# Patient Record
Sex: Female | Born: 1990 | Race: Black or African American | Marital: Single | Smoking: Never smoker
Health system: Southern US, Community
[De-identification: ages and names within clinical notes are randomized; demographics above are authoritative.]

## PROBLEM LIST (undated history)

## (undated) DIAGNOSIS — F32A Depression, unspecified: Secondary | ICD-10-CM

## (undated) DIAGNOSIS — F819 Developmental disorder of scholastic skills, unspecified: Secondary | ICD-10-CM

## (undated) DIAGNOSIS — R4189 Other symptoms and signs involving cognitive functions and awareness: Secondary | ICD-10-CM

## (undated) DIAGNOSIS — F419 Anxiety disorder, unspecified: Secondary | ICD-10-CM

## (undated) DIAGNOSIS — K219 Gastro-esophageal reflux disease without esophagitis: Secondary | ICD-10-CM

## (undated) DIAGNOSIS — F329 Major depressive disorder, single episode, unspecified: Secondary | ICD-10-CM

---

## 2011-10-28 ENCOUNTER — Other Ambulatory Visit (HOSPITAL_COMMUNITY): Payer: Self-pay | Admitting: Internal Medicine

## 2011-10-28 ENCOUNTER — Ambulatory Visit (HOSPITAL_COMMUNITY)
Admission: RE | Admit: 2011-10-28 | Discharge: 2011-10-28 | Disposition: A | Discharge status: Home / Self Care | Payer: Medicaid Other | Source: Ambulatory Visit | Attending: Internal Medicine | Admitting: Internal Medicine

## 2011-10-28 DIAGNOSIS — R52 Pain, unspecified: Secondary | ICD-10-CM

## 2011-10-28 DIAGNOSIS — M79609 Pain in unspecified limb: Secondary | ICD-10-CM | POA: Insufficient documentation

## 2012-11-18 ENCOUNTER — Encounter (HOSPITAL_COMMUNITY): Payer: Self-pay | Admitting: Emergency Medicine

## 2012-11-18 ENCOUNTER — Emergency Department (HOSPITAL_COMMUNITY)
Admission: EM | Admit: 2012-11-18 | Discharge: 2012-11-18 | Disposition: A | Discharge status: Home / Self Care | Payer: Medicaid Other | Attending: Emergency Medicine | Admitting: Emergency Medicine

## 2012-11-18 ENCOUNTER — Emergency Department (HOSPITAL_COMMUNITY): Payer: Medicaid Other

## 2012-11-18 DIAGNOSIS — K824 Cholesterolosis of gallbladder: Secondary | ICD-10-CM | POA: Insufficient documentation

## 2012-11-18 DIAGNOSIS — R109 Unspecified abdominal pain: Secondary | ICD-10-CM

## 2012-11-18 DIAGNOSIS — N390 Urinary tract infection, site not specified: Secondary | ICD-10-CM | POA: Insufficient documentation

## 2012-11-18 DIAGNOSIS — R110 Nausea: Secondary | ICD-10-CM | POA: Insufficient documentation

## 2012-11-18 DIAGNOSIS — Z3202 Encounter for pregnancy test, result negative: Secondary | ICD-10-CM | POA: Insufficient documentation

## 2012-11-18 DIAGNOSIS — R1011 Right upper quadrant pain: Secondary | ICD-10-CM | POA: Insufficient documentation

## 2012-11-18 DIAGNOSIS — R197 Diarrhea, unspecified: Secondary | ICD-10-CM | POA: Insufficient documentation

## 2012-11-18 DIAGNOSIS — R358 Other polyuria: Secondary | ICD-10-CM | POA: Insufficient documentation

## 2012-11-18 DIAGNOSIS — R3589 Other polyuria: Secondary | ICD-10-CM | POA: Insufficient documentation

## 2012-11-18 LAB — COMPREHENSIVE METABOLIC PANEL
ALT: 54 U/L — ABNORMAL HIGH (ref 0–35)
Albumin: 3.6 g/dL (ref 3.5–5.2)
Alkaline Phosphatase: 66 U/L (ref 39–117)
BUN: 13 mg/dL (ref 6–23)
CO2: 27 mEq/L (ref 19–32)
Chloride: 101 mEq/L (ref 96–112)
Creatinine, Ser: 0.83 mg/dL (ref 0.50–1.10)
GFR calc Af Amer: >90 mL/min (ref >90)
GFR calc non Af Amer: >90 mL/min (ref >90)
Glucose, Bld: 118 mg/dL — ABNORMAL HIGH (ref 70–99)
Potassium: 3.7 mEq/L (ref 3.5–5.1)
Total Bilirubin: 0.2 mg/dL — ABNORMAL LOW (ref 0.3–1.2)

## 2012-11-18 LAB — URINALYSIS, ROUTINE W REFLEX MICROSCOPIC
Bilirubin Urine: NEGATIVE
Ketones, ur: NEGATIVE mg/dL
Nitrite: POSITIVE — AB
Protein, ur: 100 mg/dL — AB
Urobilinogen, UA: 1 mg/dL (ref 0.0–1.0)

## 2012-11-18 LAB — CBC WITH DIFFERENTIAL/PLATELET
Basophils Relative: 0 % (ref 0–1)
Eosinophils Absolute: 0.1 10*3/uL (ref 0.0–0.7)
HCT: 31.7 % — ABNORMAL LOW (ref 36.0–46.0)
Hemoglobin: 10.9 g/dL — ABNORMAL LOW (ref 12.0–15.0)
Lymphs Abs: 2.5 10*3/uL (ref 0.7–4.0)
MCH: 28.7 pg (ref 26.0–34.0)
MCHC: 34.4 g/dL (ref 30.0–36.0)
MCV: 83.4 fL (ref 78.0–100.0)
Monocytes Absolute: 0.5 10*3/uL (ref 0.1–1.0)
Monocytes Relative: 6 % (ref 3–12)
Neutro Abs: 5 10*3/uL (ref 1.7–7.7)
Neutrophils Relative %: 61 % (ref 43–77)
RBC: 3.8 MIL/uL — ABNORMAL LOW (ref 3.87–5.11)

## 2012-11-18 LAB — URINE MICROSCOPIC-ADD ON

## 2012-11-18 LAB — LIPASE, BLOOD: Lipase: 31 U/L (ref 11–59)

## 2012-11-18 MED ORDER — ONDANSETRON 4 MG PO TBDP
4.0000 mg | ORAL_TABLET | Freq: Once | ORAL | Status: AC
  Administered 2012-11-18: 4 mg via ORAL
  Filled 2012-11-18: qty 1

## 2012-11-18 MED ORDER — TRAMADOL HCL 50 MG PO TABS
50.0000 mg | ORAL_TABLET | Freq: Once | ORAL | Status: AC
  Administered 2012-11-18: 50 mg via ORAL
  Filled 2012-11-18: qty 1

## 2012-11-18 MED ORDER — TRAMADOL HCL 50 MG PO TABS: 50.0000 mg | ORAL_TABLET | Freq: Four times a day (QID) | ORAL | Status: DC | PRN

## 2012-11-18 MED ORDER — SULFAMETHOXAZOLE-TMP DS 800-160 MG PO TABS: 1.0000 | ORAL_TABLET | Freq: Two times a day (BID) | ORAL | Status: AC

## 2012-11-18 MED ORDER — SULFAMETHOXAZOLE-TMP DS 800-160 MG PO TABS
1.0000 | ORAL_TABLET | Freq: Once | ORAL | Status: AC
  Administered 2012-11-18: 1 via ORAL
  Filled 2012-11-18: qty 1

## 2012-11-18 MED ORDER — ONDANSETRON 4 MG PO TBDP: 4.0000 mg | ORAL_TABLET | Freq: Three times a day (TID) | ORAL | Status: DC | PRN

## 2012-11-18 NOTE — ED Provider Notes (Signed)
CSN: [id number]     Arrival date & time 11/18/12  2134 History   First MD Initiated Contact with Patient 11/18/12 2225     Chief Complaint  Patient presents with  . Abdominal Pain   HPI  Patient presents with at least one week of nausea, right anterior upper abdominal pain. Onset was unclear, but it seems to be worse over the past week. Pain seems more pronounced after eating, better at rest.  Patient also complains of no polyuria.  No hematuria, no dysuria.  No fevers, chills, no chest pain, no other abdominal pain, no vaginal complaints.   History reviewed. No pertinent past medical history. History reviewed. No pertinent past surgical history. No family history on file. History  Substance Use Topics  . Smoking status: Never Smoker   . Smokeless tobacco: Not on file  . Alcohol Use: No   OB History   Grav Para Term Preterm Abortions TAB SAB Ect Mult Living                 Review of Systems  Constitutional:       Per HPI, otherwise negative  HENT:       Per HPI, otherwise negative  Respiratory:       Per HPI, otherwise negative  Cardiovascular:       Per HPI, otherwise negative  Gastrointestinal: Positive for nausea and diarrhea. Negative for vomiting.  Endocrine: Positive for polyuria.  Genitourinary:       Neg aside from HPI   Musculoskeletal:       Per HPI, otherwise negative  Skin: Negative.   Neurological: Negative for syncope.    Allergies  Review of patient's allergies indicates no known allergies.  Home Medications   Current Outpatient Rx  Name  Route  Sig  Dispense  Refill  . aspirin-sod bicarb-citric acid (ALKA-SELTZER) 325 MG TBEF tablet   Oral   Take 325 mg by mouth once.         . Bisacodyl (LAXATIVE PO)   Oral   Take 1 tablet by mouth once.         Marland Kitchen PRESCRIPTION MEDICATION   Oral   Take 1 tablet by mouth daily.          BP 133/71  Pulse 93  Temp(Src) 98.2 F (36.8 C) (Oral)  Resp 18  Ht 5\' 3"  (1.6 m)  Wt 215 lb (97.523 kg)   BMI 38.09 kg/m2  SpO2 100%  LMP 11/11/2012 Physical Exam  Nursing note and vitals reviewed. Constitutional: She is oriented to person, place, and time. She appears well-developed and well-nourished. No distress.  HENT:  Head: Normocephalic and atraumatic.  Eyes: Conjunctivae and EOM are normal.  Cardiovascular: Normal rate and regular rhythm.   Pulmonary/Chest: Effort normal and breath sounds normal. No stridor. No respiratory distress.  Abdominal: She exhibits no distension. There is no tenderness. There is no rebound and no guarding.  Patient indicates that her pain is at the infracostal margin on R mid anterior chest  Musculoskeletal: She exhibits no edema.  Neurological: She is alert and oriented to person, place, and time. No cranial nerve deficit.  Skin: Skin is warm and dry.  Psychiatric: She has a normal mood and affect.    ED Course  Procedures (including critical care time) Labs Review Labs Reviewed  COMPREHENSIVE METABOLIC PANEL - Abnormal; Notable for the following:    Glucose, Bld 118 (*)    AST 52 (*)    ALT 54 (*)  Total Bilirubin 0.2 (*)    All other components within normal limits  URINALYSIS, ROUTINE W REFLEX MICROSCOPIC - Abnormal; Notable for the following:    APPearance TURBID (*)    Hgb urine dipstick LARGE (*)    Protein, ur 100 (*)    Nitrite POSITIVE (*)    Leukocytes, UA MODERATE (*)    All other components within normal limits  URINE MICROSCOPIC-ADD ON - Abnormal; Notable for the following:    Squamous Epithelial / LPF FEW (*)    Bacteria, UA MANY (*)    All other components within normal limits  URINE CULTURE  LIPASE, BLOOD  CBC WITH DIFFERENTIAL  POCT PREGNANCY, URINE   Imaging Review US Abdomen Complete  11/18/2012   CLINICAL DATA:  Right upper quadrant pain.  EXAM: ULTRASOUND ABDOMEN COMPLETE  COMPARISON:  None.  FINDINGS: Gallbladder  4 mm non mobile non shadowing focus along the gallbladder wall, likely small polyp. No wall thickening  or visible stones. Negative sonographic Murphy's.  Common bile duct  Diameter: Normal caliber, 3 mm.  Liver  No focal lesion identified. Within normal limits in parenchymal echogenicity.  IVC  No abnormality visualized.  Pancreas  Visualized portion unremarkable.  Spleen  Size and appearance within normal limits.  Right Kidney  Length: 9.6 cm. Echogenicity within normal limits. No mass or hydronephrosis visualized.  Left Kidney  Length: 10.9 cm. Echogenicity within normal limits. No mass or hydronephrosis visualized.  Abdominal aorta  No aneurysm visualized.  IMPRESSION: Small gallbladder wall polyp.  No acute findings.   Electronically Signed   By: Charlett Nose M.D.   On: 11/18/2012 22:44    MDM  No diagnosis found. This generally well-appearing female, afebrile, with unremarkable vital signs presents with polyuria, abdominal pain.  On exam patient is no tenderness to palpation of her abdomen, but she indicates there is pain in the right upper quadrant.  Ultrasound was reassuring, though there is evidence of biliary polyp. Labs demonstrate a urinary tract infection.  Absent fever, peritoneal findings, distress, the patient will be discharged in stable condition to follow up with her primary care physician.    Gerhard Munch, MD 11/18/12 [phone]

## 2012-11-18 NOTE — ED Notes (Signed)
C/o RUQ pain x 1 week.  Denies nausea, vomiting, diarrhea, or urinary complaints.

## 2012-11-18 NOTE — ED Notes (Signed)
Patient explains that she has been experiencing pain in the RUQ of the abdomen throughout the week. Patient explains that the pain comes and goes, and is aggravated by eating.

## 2012-11-21 LAB — URINE CULTURE: Colony Count: >=100000

## 2012-11-22 ENCOUNTER — Telehealth (HOSPITAL_COMMUNITY): Payer: Self-pay | Admitting: *Deleted

## 2012-11-22 NOTE — ED Notes (Signed)
Post ED Visit - Positive Culture Follow-up: Successful Patient Follow-Up  Culture assessed and recommendations reviewed by: []  Wes Dulaney, Pharm.D., BCPS []  Celedonio Miyamoto, [address].D., BCPS []  Georgina Pillion, [address].D., BCPS []  [place], [address].D., BCPS, AAHIVP [x]  Estella Husk, Pharm.D., BCPS, AAHIVP  Positive urine culture  [X]  Treated with Septra, organism resistant to prescribed antimicrobial  New antibiotic prescription: Keflex 500mg  PO TID x 5 days  ED Provider: Marlon Pel, PA-C    Larena Sox 11/22/2012, 2:47 PM

## 2012-11-22 NOTE — Progress Notes (Signed)
ED Antimicrobial Stewardship Positive Culture Follow Up   Kristi Fletcher is an 22 y.o. female who presented to [facility] on 11/18/2012 with a chief complaint of  Chief Complaint  Patient presents with  . Abdominal Pain    Recent Results (from the past 720 hour(s))  URINE CULTURE     Status: None   Collection Time    11/18/12  9:47 PM      Result Value Range Status   Specimen Description URINE, CLEAN CATCH   Final   Special Requests ADDED 2230   Final   Culture  Setup Time     Final   Value: 11/18/2012 22:41     Performed at [facility] Count     Final   Value: >=100,000 COLONIES/ML     Performed at [facility]   Culture     Final   Value: ESCHERICHIA COLI     Performed at [facility]   Report Status 11/21/2012 FINAL   Final   Organism ID, Bacteria ESCHERICHIA COLI   Final    [x]  Treated with Septra, organism resistant to prescribed antimicrobial  New antibiotic prescription: Keflex 500mg  PO TID x 5 days  ED Provider: Marlon Pel, Alroy Bailiff 11/22/2012, 12:06 PM Infectious Diseases Pharmacist Phone# [phone]

## 2012-11-24 ENCOUNTER — Telehealth (HOSPITAL_COMMUNITY): Payer: Self-pay | Admitting: Emergency Medicine

## 2012-11-25 ENCOUNTER — Telehealth (HOSPITAL_COMMUNITY): Payer: Self-pay | Admitting: Emergency Medicine

## 2012-11-25 NOTE — ED Notes (Signed)
Unable to contact patient via phone. Sent letter. °

## 2012-12-01 ENCOUNTER — Telehealth (HOSPITAL_COMMUNITY): Payer: Self-pay | Admitting: *Deleted

## 2012-12-01 NOTE — ED Notes (Signed)
Mother called and sts she got a letter in mail re: test results.  Mother sts pt is mentally disabled, so she is her caretaker.  I told mother about the Urine Culture + for E. Coli and we wanted to change the Rx from Septra to Keflex 500mg  PO TID x 5 days per Marlon Pel PA.  Rx called into [facility] at [phone] per her request.

## 2012-12-29 ENCOUNTER — Encounter (HOSPITAL_COMMUNITY): Payer: Self-pay | Admitting: Emergency Medicine

## 2012-12-29 ENCOUNTER — Emergency Department (HOSPITAL_COMMUNITY)
Admission: EM | Admit: 2012-12-29 | Discharge: 2012-12-29 | Disposition: A | Discharge status: Home / Self Care | Payer: Medicaid Other | Source: Home / Self Care

## 2012-12-29 DIAGNOSIS — R0789 Other chest pain: Secondary | ICD-10-CM

## 2012-12-29 DIAGNOSIS — R071 Chest pain on breathing: Secondary | ICD-10-CM

## 2012-12-29 LAB — POCT URINALYSIS DIP (DEVICE)
Glucose, UA: NEGATIVE mg/dL
Ketones, ur: 40 mg/dL — AB
Nitrite: NEGATIVE
Protein, ur: NEGATIVE mg/dL
Urobilinogen, UA: 1 mg/dL (ref 0.0–1.0)
pH: 5.5 (ref 5.0–8.0)

## 2012-12-29 LAB — POCT PREGNANCY, URINE: Preg Test, Ur: NEGATIVE

## 2012-12-29 MED ORDER — HYDROCODONE-ACETAMINOPHEN 5-325 MG PO TABS
1.0000 | ORAL_TABLET | Freq: Four times a day (QID) | ORAL | Status: DC | PRN
Start: 2012-12-29 — End: 2016-04-18

## 2012-12-29 NOTE — ED Notes (Signed)
Pt     Reports    l  Upper       quadrant  Pain         Since  Last  Night       With  Nausea            Symptoms  Started  Last  Pm     No  Diarrhea       Ambulated  To    Room  With           Steady  Gait

## 2012-12-29 NOTE — ED Provider Notes (Signed)
CSN: [id number]     Arrival date & time 12/29/12  1818 History   None    Chief Complaint  Patient presents with  . Abdominal Pain   (Consider location/radiation/quality/duration/timing/severity/associated sxs/prior Treatment) Patient is a 22 y.o. female presenting with abdominal pain. The history is provided by the patient and a parent.  Abdominal Pain This is a new problem. The current episode started yesterday. The problem has not changed since onset.Associated symptoms include chest pain. Pertinent negatives include no abdominal pain.    History reviewed. No pertinent past medical history. History reviewed. No pertinent past surgical history. History reviewed. No pertinent family history. History  Substance Use Topics  . Smoking status: Never Smoker   . Smokeless tobacco: Not on file  . Alcohol Use: No   OB History   Grav Para Term Preterm Abortions TAB SAB Ect Mult Living                 Review of Systems  Constitutional: Negative.   HENT: Negative.   Respiratory: Negative.   Cardiovascular: Positive for chest pain.  Gastrointestinal: Negative.  Negative for abdominal pain.    Allergies  Review of patient's allergies indicates no known allergies.  Home Medications   Current Outpatient Rx  Name  Route  Sig  Dispense  Refill  . aspirin-sod bicarb-citric acid (ALKA-SELTZER) 325 MG TBEF tablet   Oral   Take 325 mg by mouth once.         . Bisacodyl (LAXATIVE PO)   Oral   Take 1 tablet by mouth once.         Marland Kitchen HYDROcodone-acetaminophen (NORCO/VICODIN) 5-325 MG per tablet   Oral   Take 1 tablet by mouth every 6 (six) hours as needed.   10 tablet   0   . ondansetron (ZOFRAN-ODT) 4 MG disintegrating tablet   Oral   Take 1 tablet (4 mg total) by mouth every 8 (eight) hours as needed for nausea.   10 tablet   0   . PRESCRIPTION MEDICATION   Oral   Take 1 tablet by mouth daily.         . traMADol (ULTRAM) 50 MG tablet   Oral   Take 1 tablet (50 mg  total) by mouth every 6 (six) hours as needed for pain.   15 tablet   0    BP 109/67  Pulse 79  Temp(Src) 98.1 F (36.7 C) (Oral)  Resp 18  SpO2 100%  LMP 12/05/2012 Physical Exam  Nursing note and vitals reviewed. Constitutional: She is oriented to person, place, and time. She appears well-developed and well-nourished.  Neck: Normal range of motion. Neck supple.  Cardiovascular: Normal rate, normal heart sounds and intact distal pulses.   Pulmonary/Chest: Breath sounds normal. She exhibits tenderness.  Left costal margin /rib soreness to palpation, reproducing sx.  Abdominal: Soft. Bowel sounds are normal.  Lymphadenopathy:    She has no cervical adenopathy.  Neurological: She is alert and oriented to person, place, and time.  Skin: Skin is warm and dry.    ED Course  Procedures (including critical care time) Labs Review Labs Reviewed  POCT URINALYSIS DIP (DEVICE) - Abnormal; Notable for the following:    Bilirubin Urine SMALL (*)    Ketones, ur 40 (*)    Hgb urine dipstick TRACE (*)    Leukocytes, UA TRACE (*)    All other components within normal limits  POCT PREGNANCY, URINE   Imaging Review No results found.  EKG  Interpretation     Ventricular Rate:    PR Interval:    QRS Duration:   QT Interval:    QTC Calculation:   R Axis:     Text Interpretation:              MDM      Linna Hoff, MD 12/29/12 1933

## 2013-09-02 IMAGING — CR DG HAND COMPLETE 3+V*R*
3 series · 3 of 3 positions shown · non-contrast
Comparison: None.

CLINICAL DATA: Intermittent hand pain for 6 months worsening over
the past few weeks.

RIGHT HAND - COMPLETE 3+ VIEW

[x hand pa right]
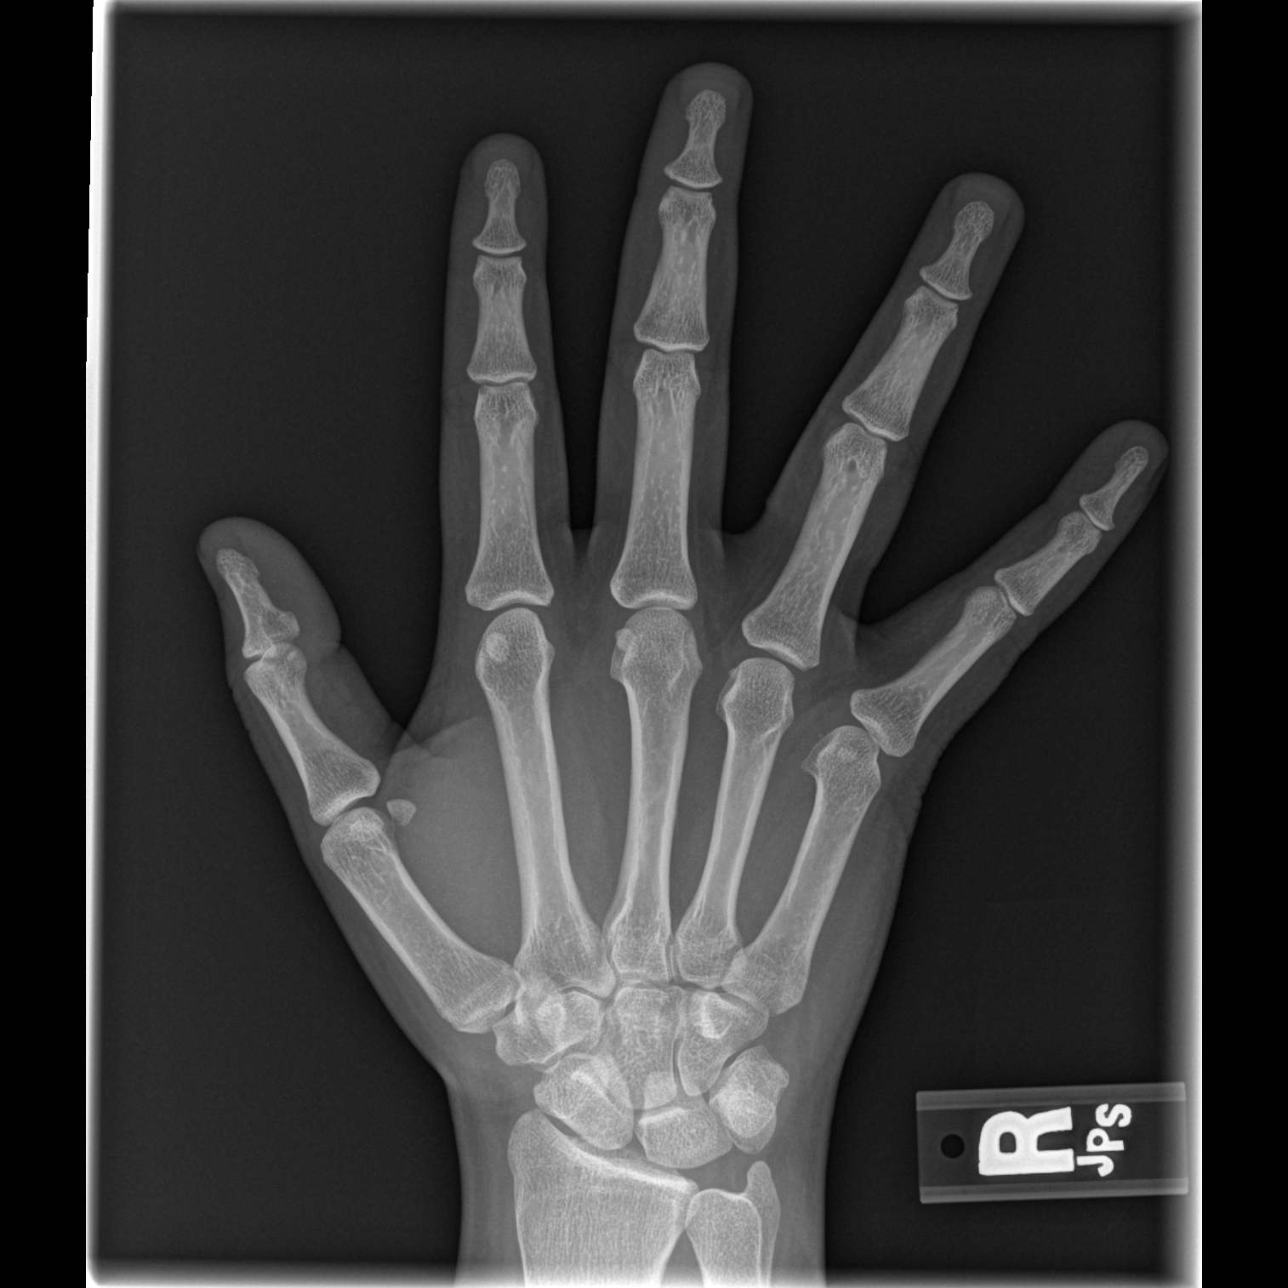

[x hand oblique right]
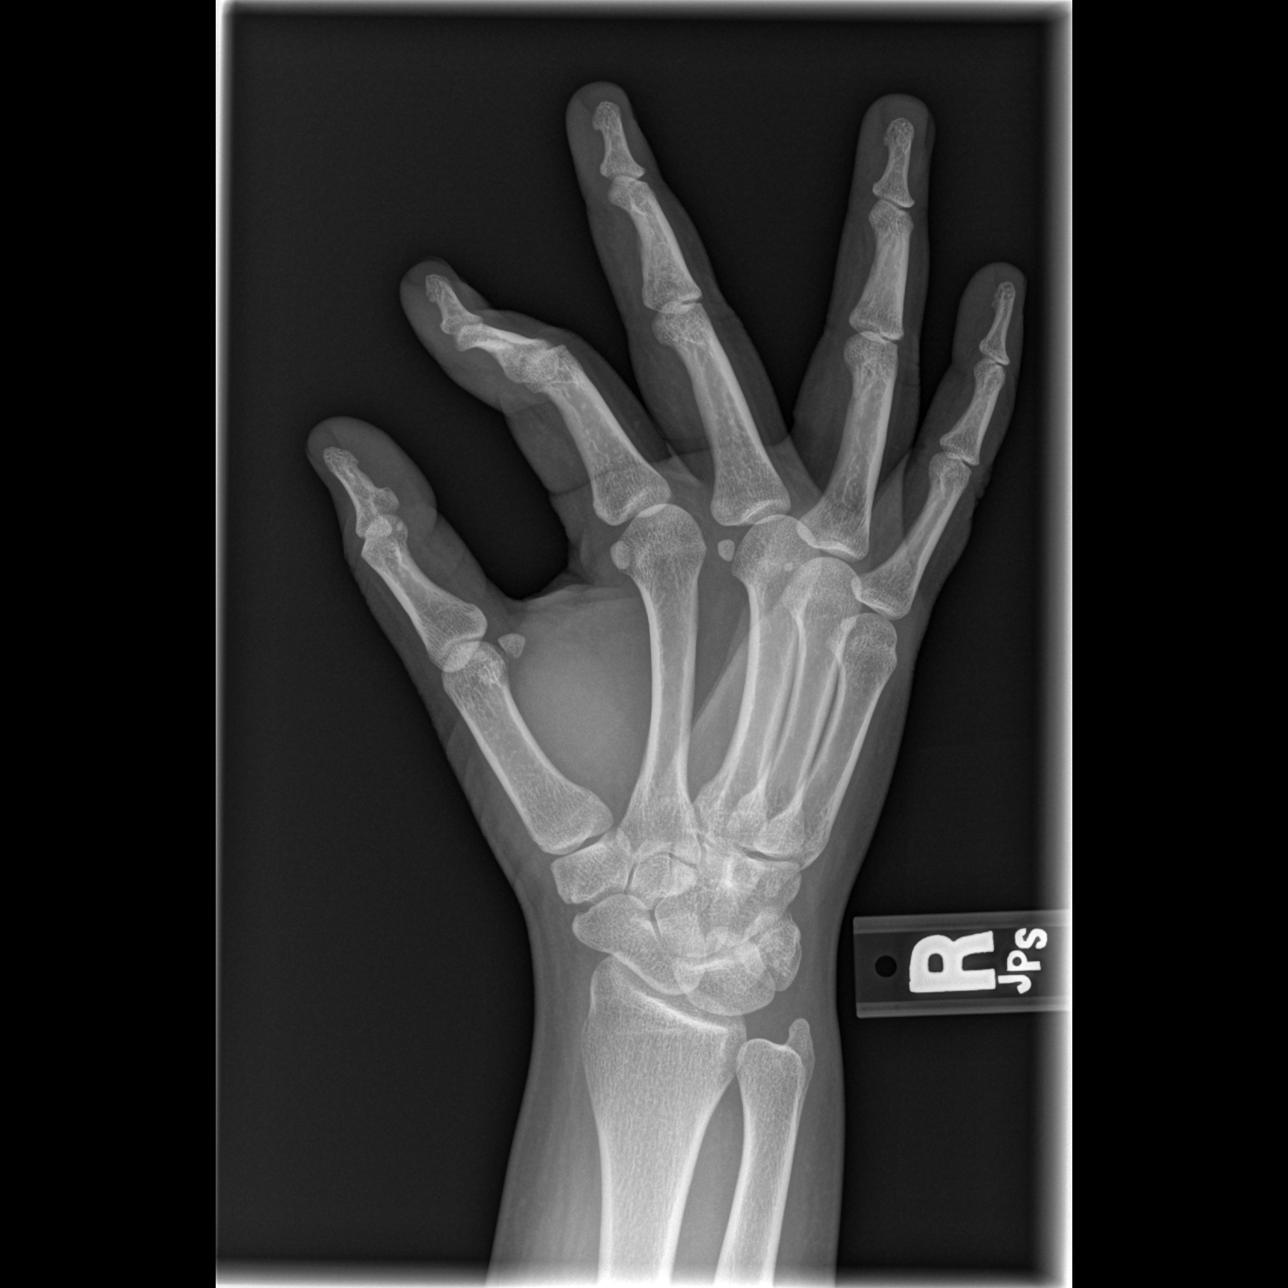

[x hand lat right]
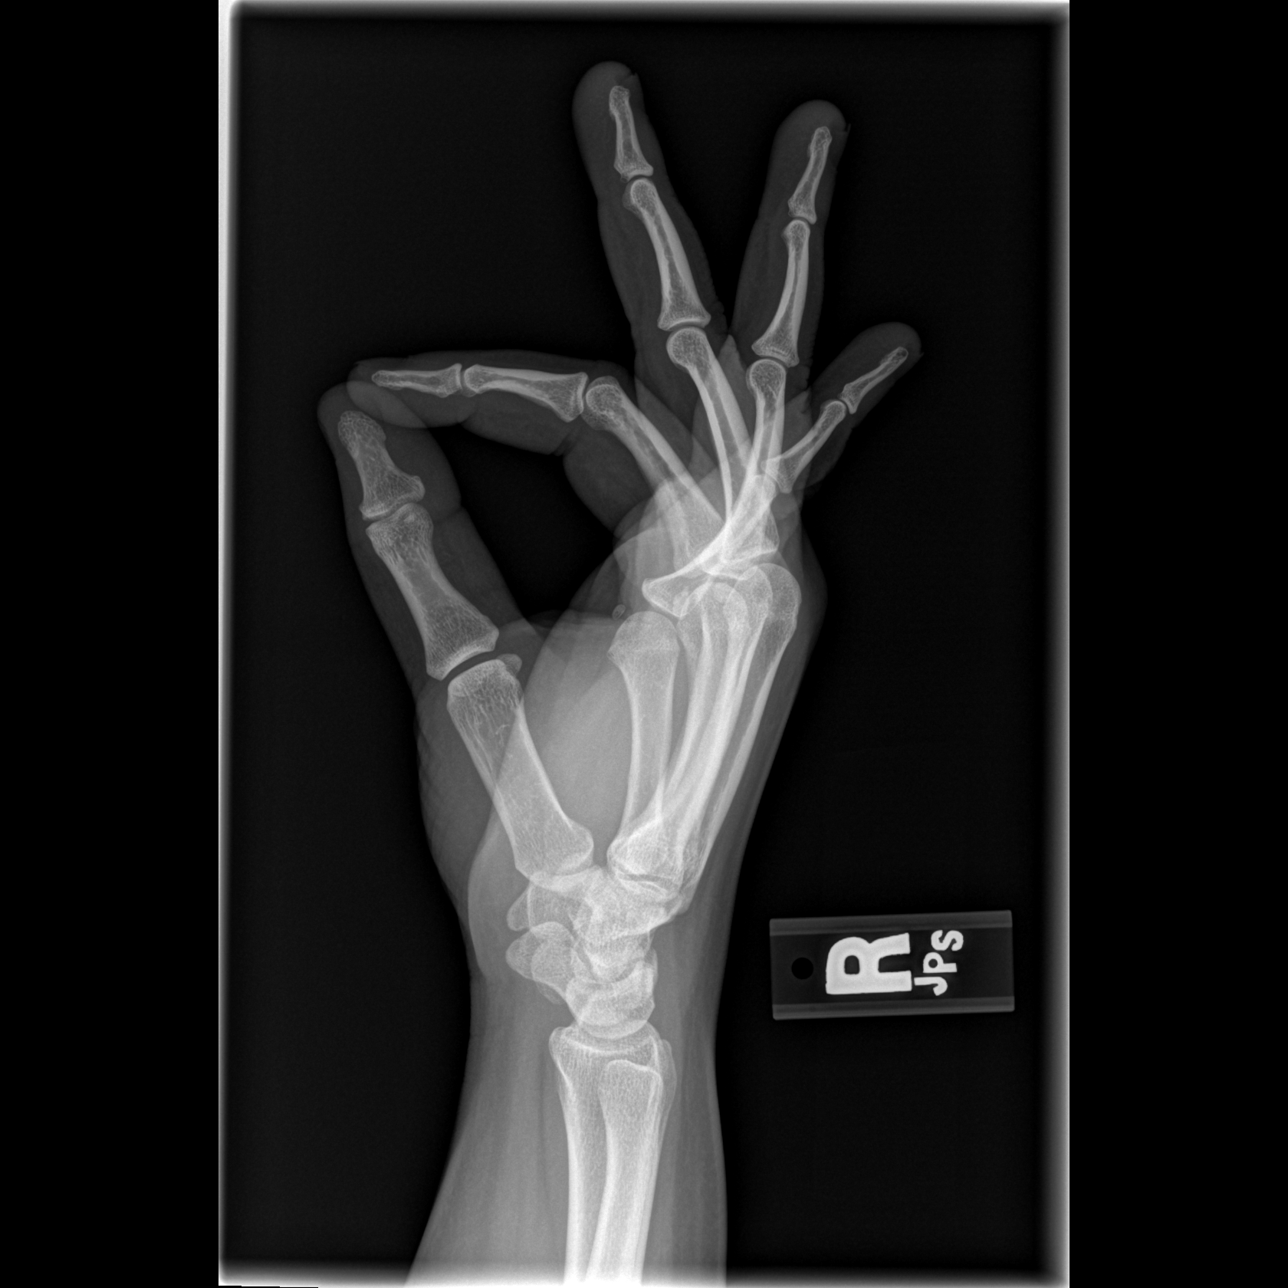

[3 of 3 positions shown; findings below may reference images not displayed]

FINDINGS: There is no acute bony or joint abnormality.  Joint
spaces and alignment are maintained.  No erosion or bony
proliferative change is seen.  Soft tissue structures are
unremarkable.
IMPRESSION: Normal exam.

## 2013-09-02 IMAGING — CR DG HAND COMPLETE 3+V*L*
3 series · 3 of 3 positions shown · non-contrast
Comparison: None.

CLINICAL DATA: Intermittent hand pain for 6 months worsening over
the past few weeks.

LEFT HAND - COMPLETE 3+ VIEW

[x hand pa left]
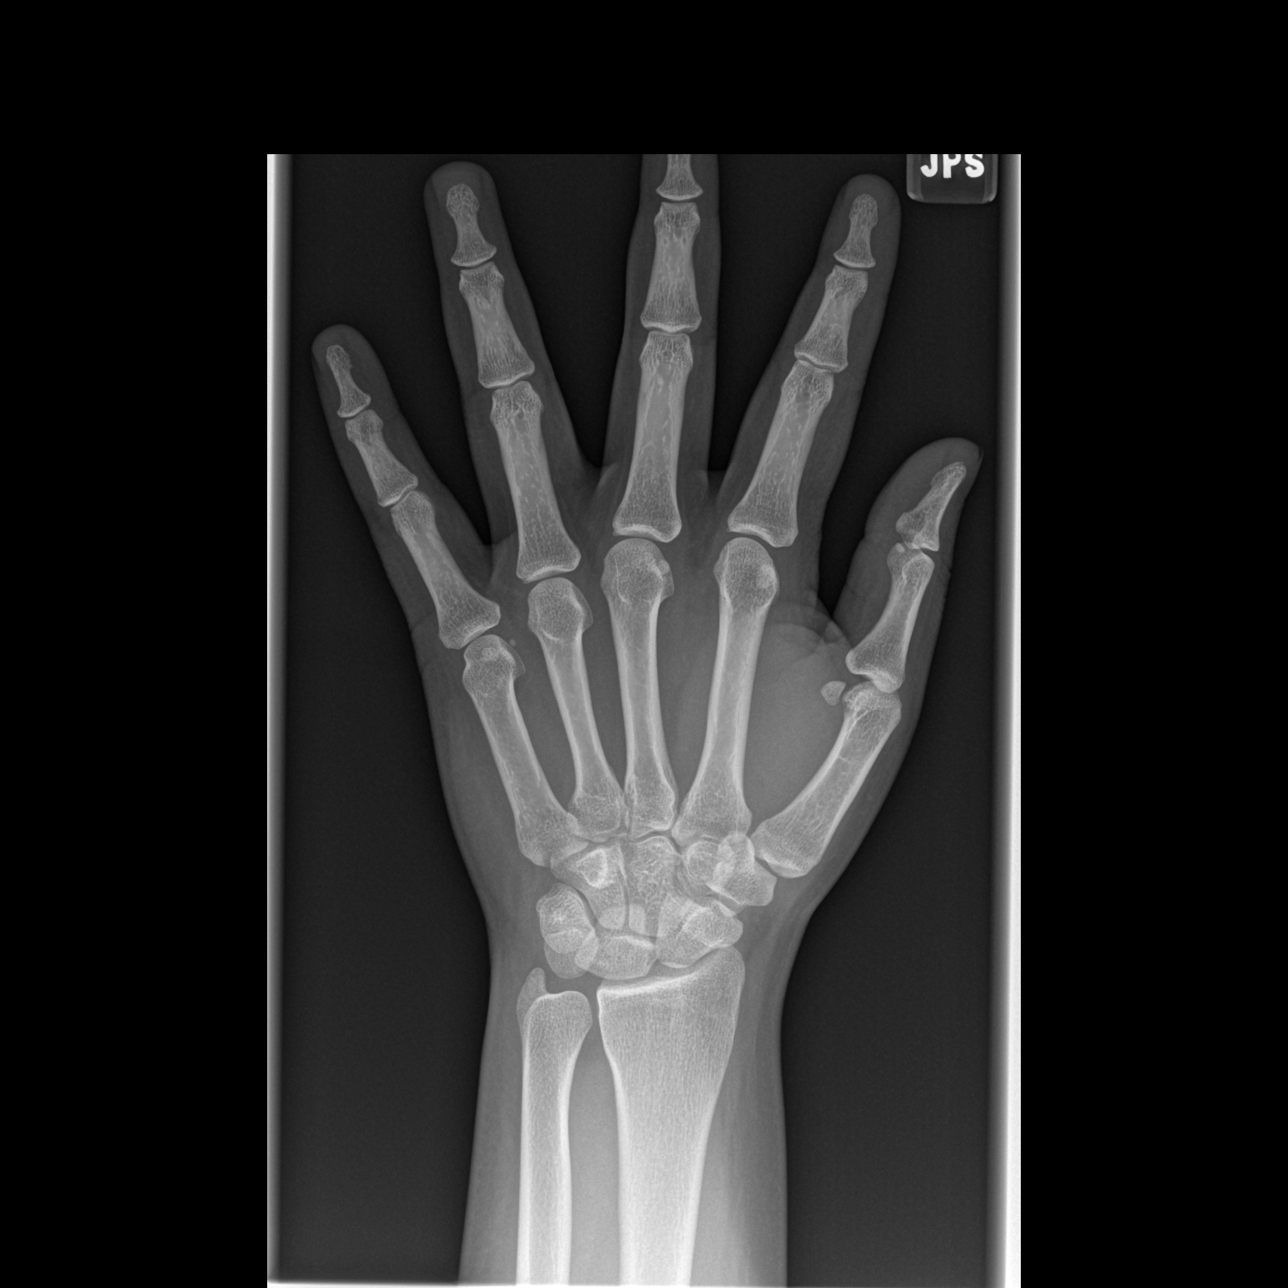

[x hand oblique left]
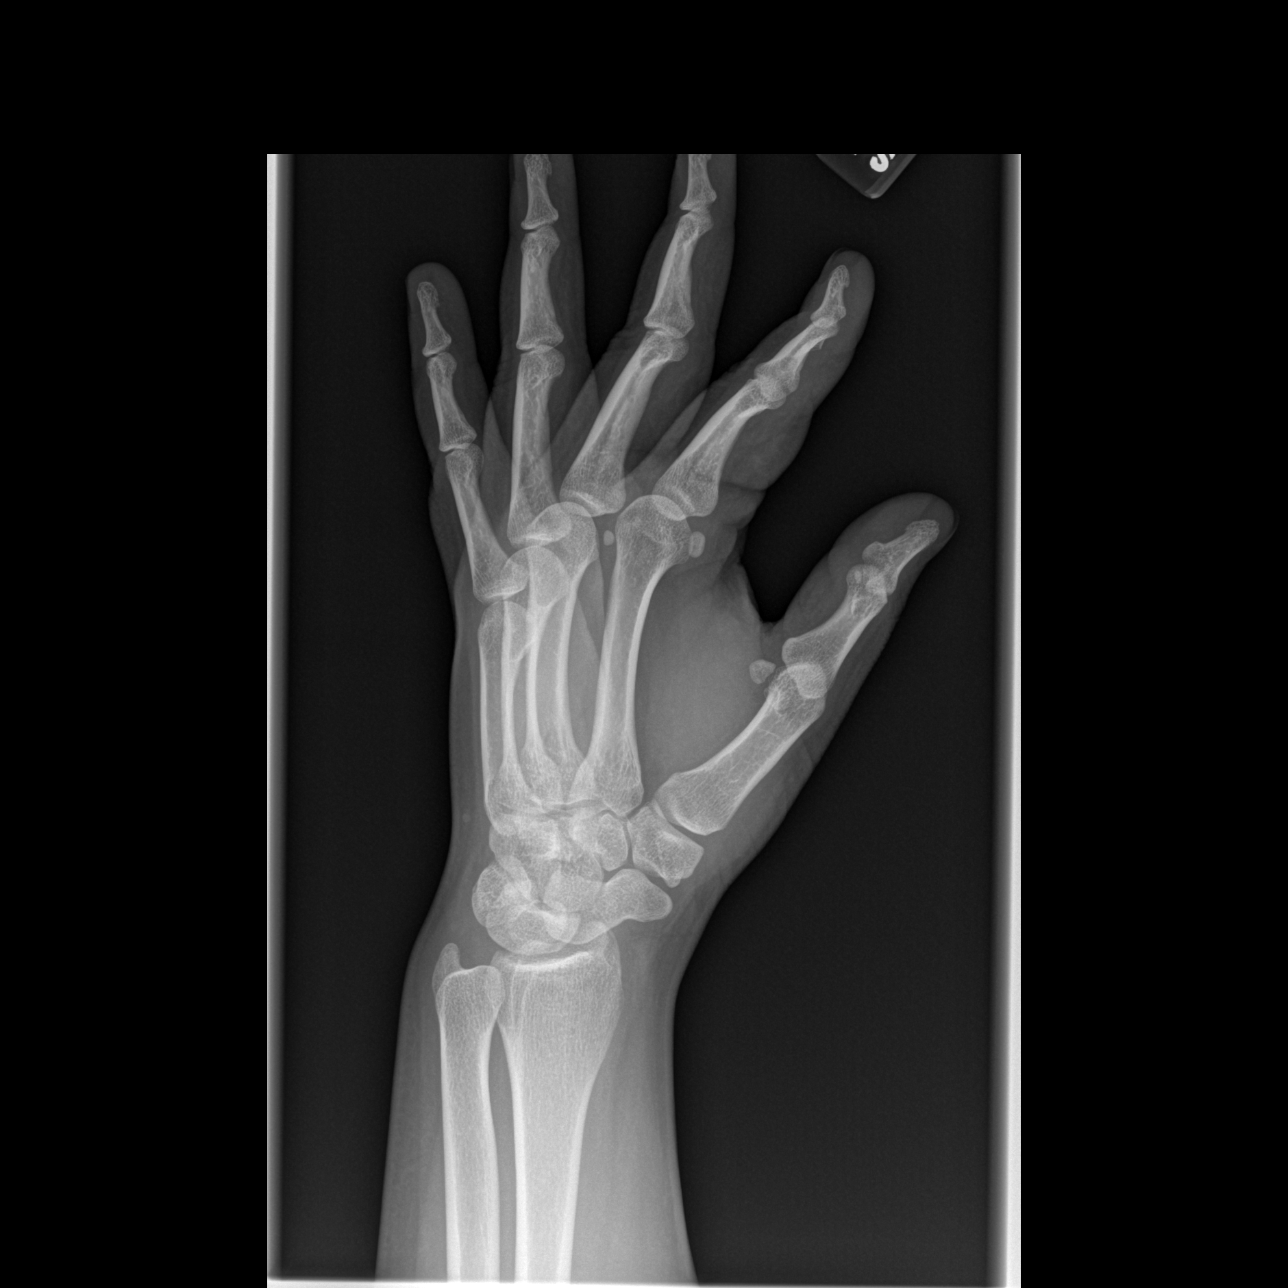

[x hand lat left]
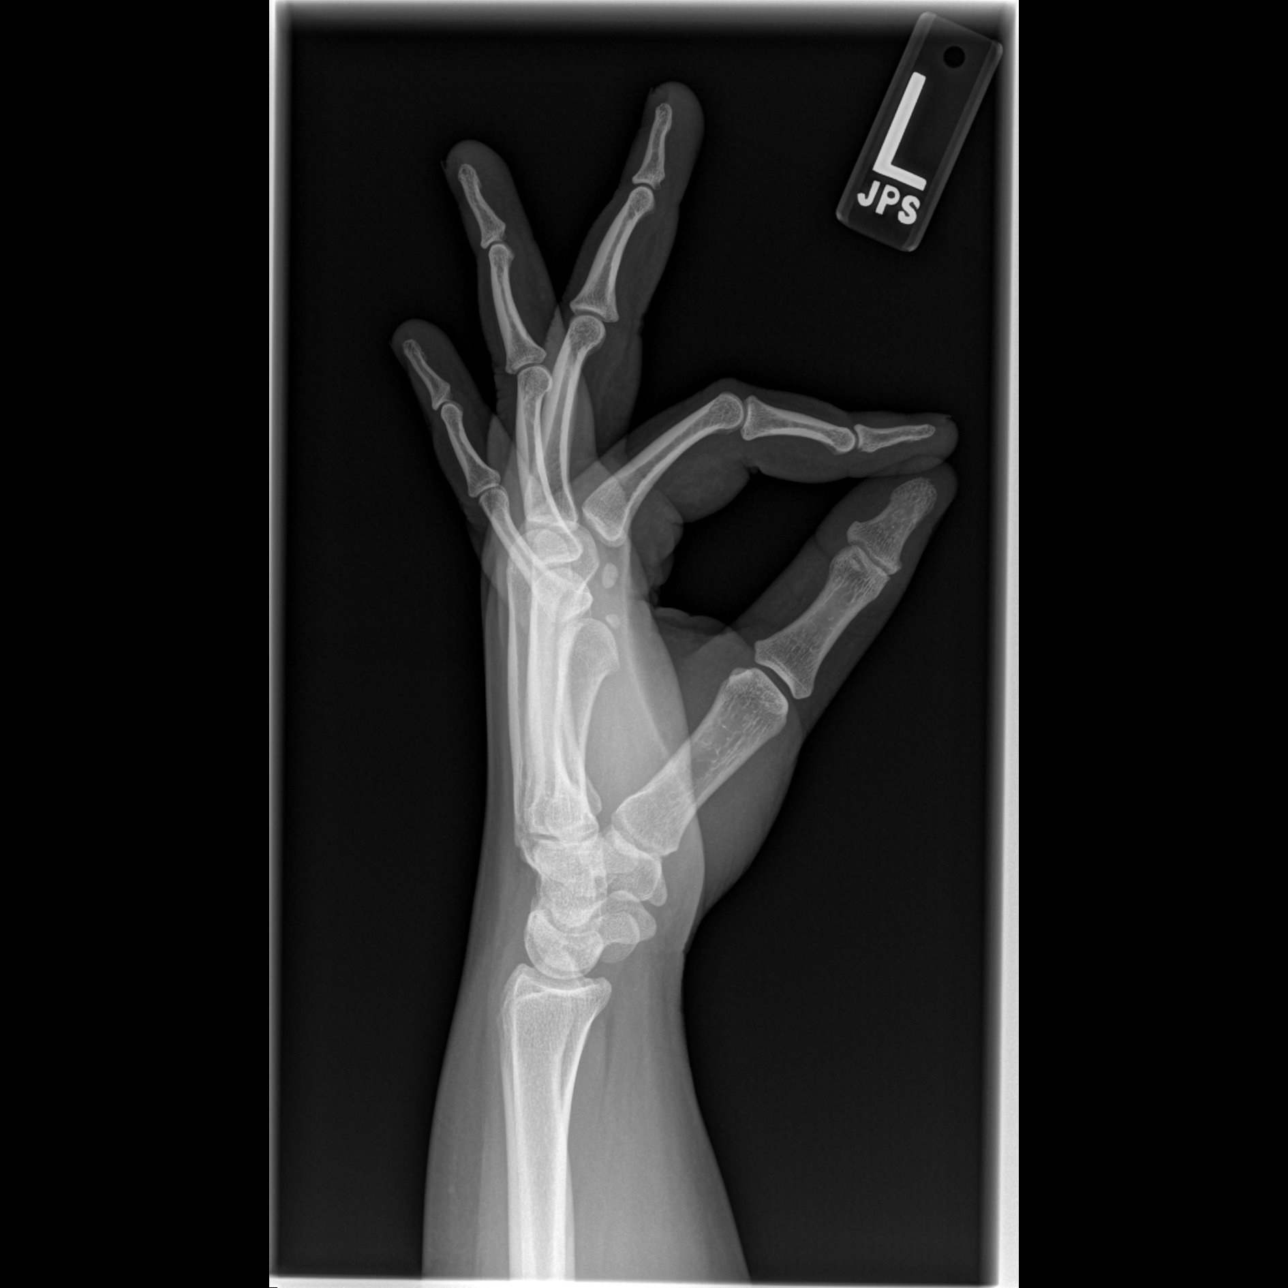

[3 of 3 positions shown; findings below may reference images not displayed]

FINDINGS: There is no acute bony or joint abnormality.  Joint
spaces and alignment are maintained.  No erosion or bony
proliferative change is seen.  Soft tissue structures are
unremarkable.
IMPRESSION: Normal exam.

## 2014-09-24 IMAGING — US US ABDOMEN COMPLETE
1 series · 14 of 25 positions shown · non-contrast
Comparison: None.

CLINICAL DATA: Right upper quadrant pain.

EXAM:
ULTRASOUND ABDOMEN COMPLETE

[Series 1: us abdomen complete · 0.18mm/px · 14 of 86 slices shown]
[im 1/86]
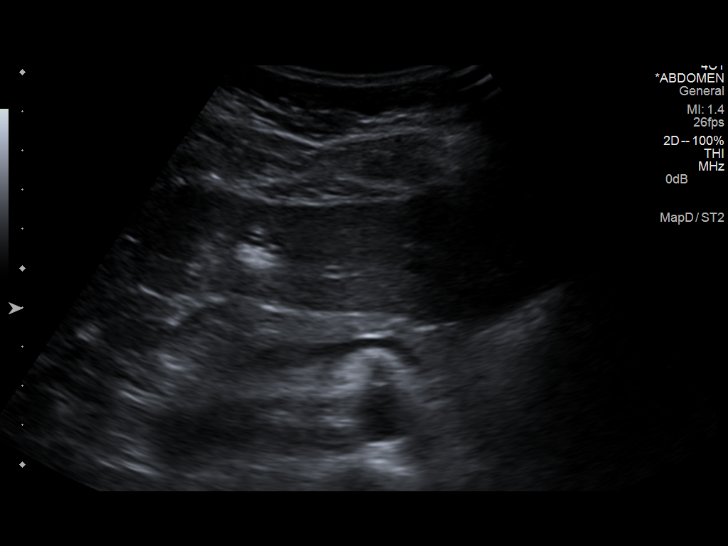
[im 8/86]
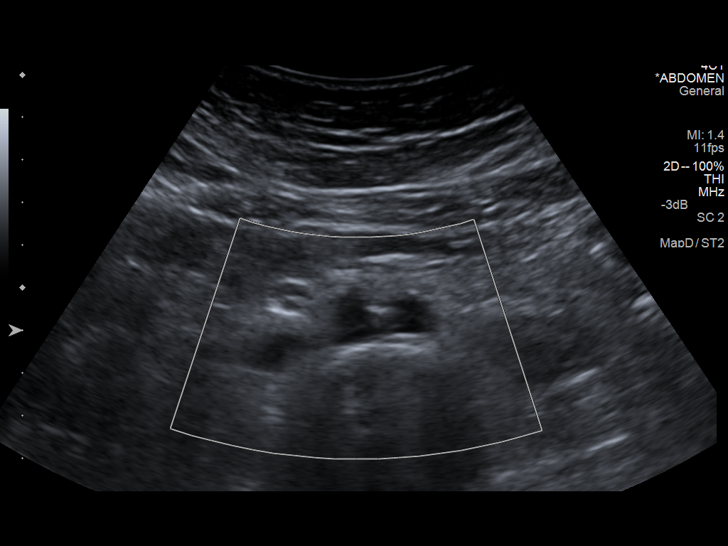
[im 15/86]
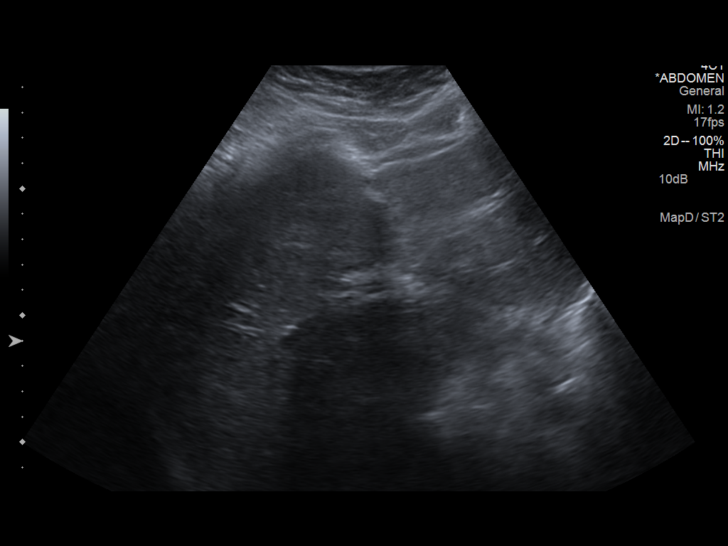
[im 22/86]
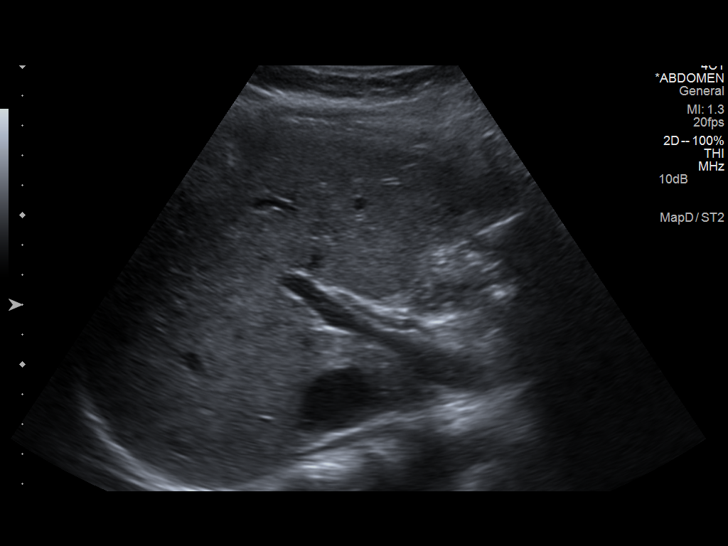
[im 29/86]
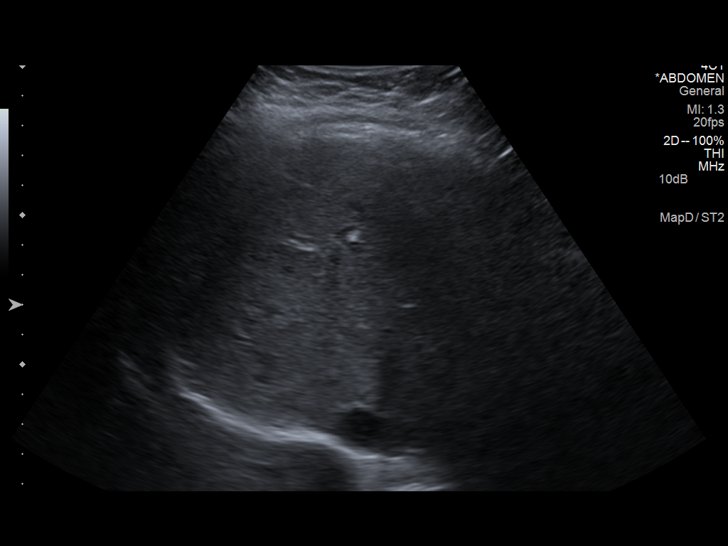
[im 32/86]
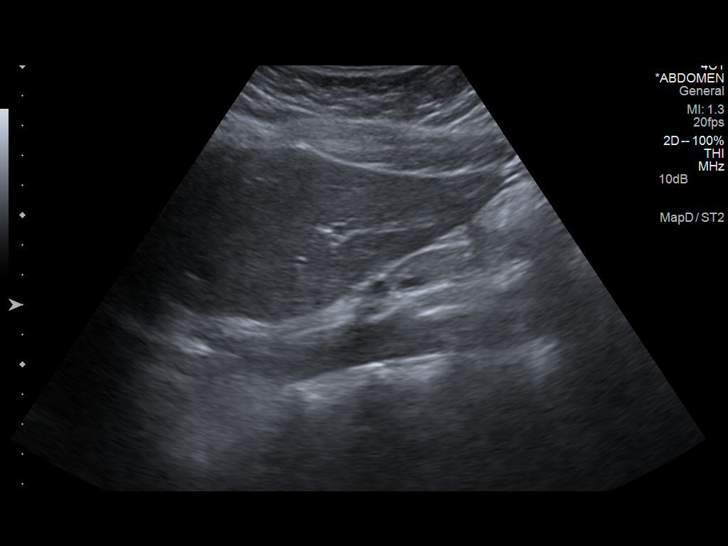
[im 39/86]
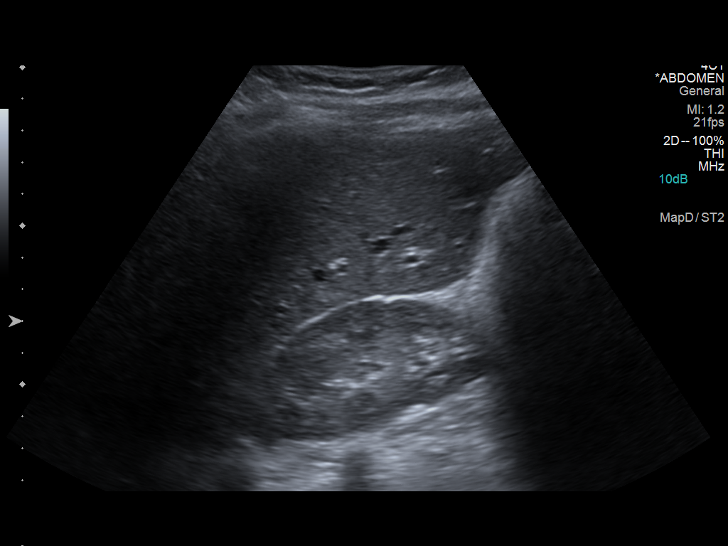
[im 47/86]
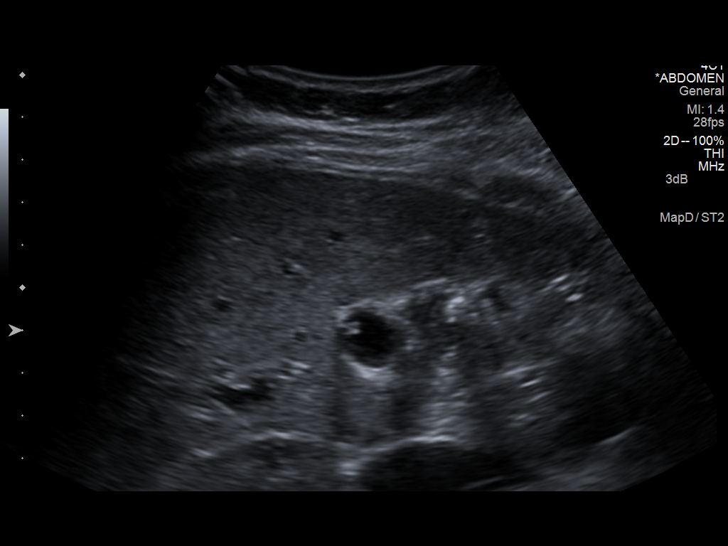
[im 54/86]
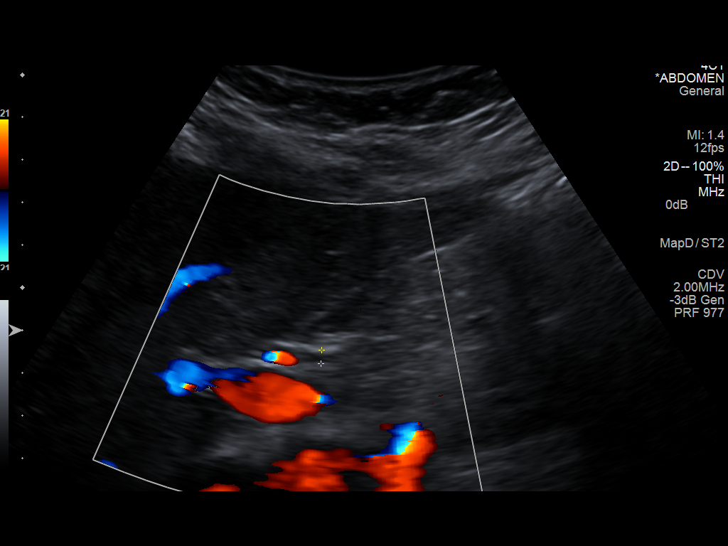
[im 57/86]
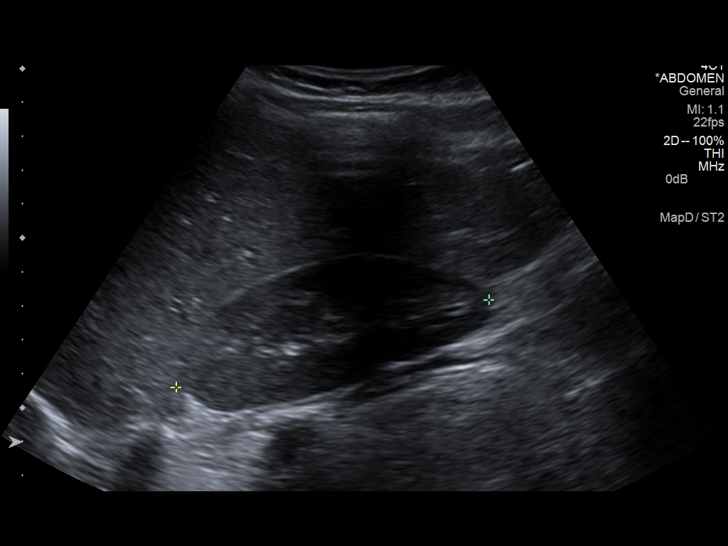
[im 64/86]
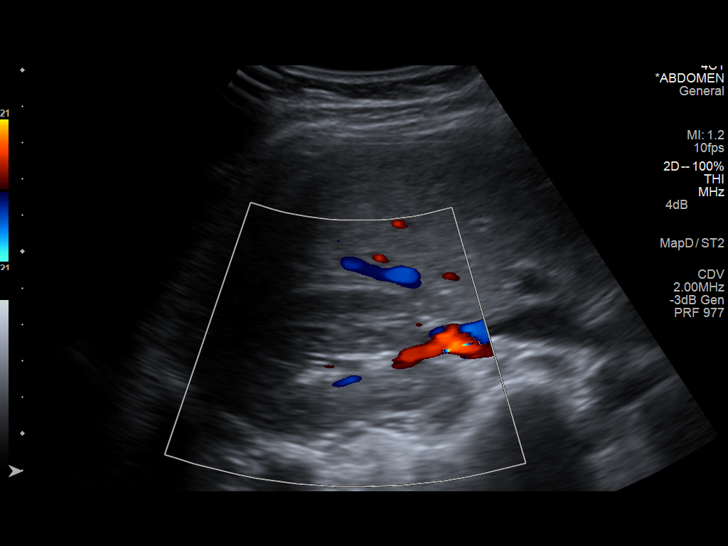
[im 71/86]
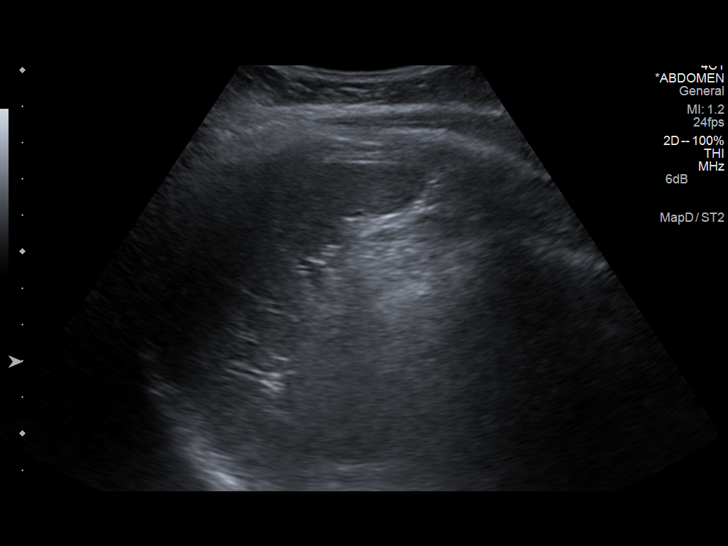
[im 78/86]
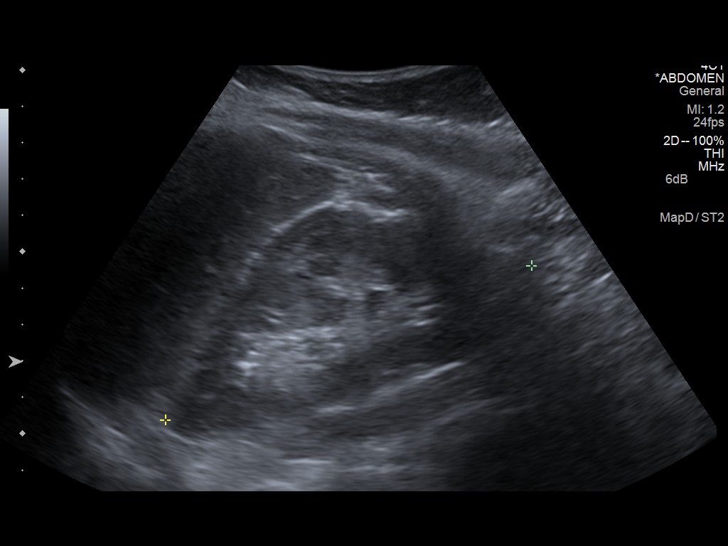
[im 86/86]
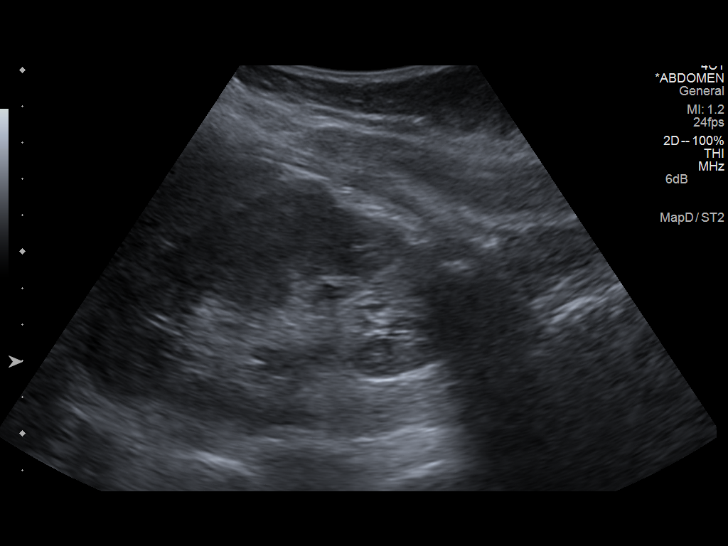

[14 of 25 positions shown; findings below may reference images not displayed]

FINDINGS: Gallbladder

4 mm non mobile non shadowing focus along the gallbladder wall,
likely small polyp. No wall thickening or visible stones. Negative
sonographic Stojadin.

Common bile duct

Diameter: Normal caliber, 3 mm.

Liver

No focal lesion identified. Within normal limits in parenchymal
echogenicity.

IVC

No abnormality visualized.

Pancreas

Visualized portion unremarkable.

Spleen

Size and appearance within normal limits.

Right Kidney

Length: 9.6 cm. Echogenicity within normal limits. No mass or
hydronephrosis visualized.

Left Kidney

Length: 10.9 cm. Echogenicity within normal limits. No mass or
hydronephrosis visualized.

Abdominal aorta

No aneurysm visualized.
IMPRESSION: Small gallbladder wall polyp.

No acute findings.

## 2016-01-08 ENCOUNTER — Emergency Department (HOSPITAL_COMMUNITY)
Admission: EM | Admit: 2016-01-08 | Discharge: 2016-01-09 | Disposition: A | Discharge status: Home / Self Care | Payer: Medicaid Other | Attending: Emergency Medicine | Admitting: Emergency Medicine

## 2016-01-08 ENCOUNTER — Encounter (HOSPITAL_COMMUNITY): Payer: Self-pay

## 2016-01-08 DIAGNOSIS — M791 Myalgia, unspecified site: Secondary | ICD-10-CM

## 2016-01-08 DIAGNOSIS — R319 Hematuria, unspecified: Secondary | ICD-10-CM | POA: Insufficient documentation

## 2016-01-08 LAB — URINALYSIS, ROUTINE W REFLEX MICROSCOPIC
Glucose, UA: NEGATIVE mg/dL
Ketones, ur: 15 mg/dL — AB
NITRITE: NEGATIVE
PROTEIN: 30 mg/dL — AB
Specific Gravity, Urine: 1.029 (ref 1.005–1.030)
pH: 6 (ref 5.0–8.0)

## 2016-01-08 LAB — COMPREHENSIVE METABOLIC PANEL
ALK PHOS: 77 U/L (ref 38–126)
ALT: 48 U/L (ref 14–54)
ANION GAP: 9 (ref 5–15)
AST: 42 U/L — ABNORMAL HIGH (ref 15–41)
Albumin: 4.1 g/dL (ref 3.5–5.0)
BUN: 14 mg/dL (ref 6–20)
CALCIUM: 9.7 mg/dL (ref 8.9–10.3)
CO2: 22 mmol/L (ref 22–32)
CREATININE: 0.81 mg/dL (ref 0.44–1.00)
Chloride: 108 mmol/L (ref 101–111)
GFR calc non Af Amer: >60 mL/min (ref >60)
Glucose, Bld: 85 mg/dL (ref 65–99)
Potassium: 3.6 mmol/L (ref 3.5–5.1)
Sodium: 139 mmol/L (ref 135–145)
Total Bilirubin: 0.4 mg/dL (ref 0.3–1.2)
Total Protein: 7.1 g/dL (ref 6.5–8.1)

## 2016-01-08 LAB — CBC
HCT: 37.5 % (ref 36.0–46.0)
Hemoglobin: 12.3 g/dL (ref 12.0–15.0)
MCH: 27.5 pg (ref 26.0–34.0)
MCHC: 32.8 g/dL (ref 30.0–36.0)
MCV: 83.9 fL (ref 78.0–100.0)
PLATELETS: 249 10*3/uL (ref 150–400)
RBC: 4.47 MIL/uL (ref 3.87–5.11)
RDW: 13.1 % (ref 11.5–15.5)
WBC: 7.7 10*3/uL (ref 4.0–10.5)

## 2016-01-08 LAB — URINE MICROSCOPIC-ADD ON

## 2016-01-08 LAB — CK: CK TOTAL: 132 U/L (ref 38–234)

## 2016-01-08 MED ORDER — SODIUM CHLORIDE 0.9 % IV BOLUS (SEPSIS): 1000.0000 mL | Freq: Once | INTRAVENOUS | Status: DC

## 2016-01-08 NOTE — ED Notes (Signed)
Took pt to restroom via wheelchair 

## 2016-01-08 NOTE — ED Triage Notes (Signed)
Pt.s  Family member reports that in the last 5 days pt. Has developed muscle pain and joint pain all over.  She reports that her MD started her on Zoloft in the last month and they are thinking it could be related to that.  Pt. Is having difficulty walking.  She denies any fevers, n/v/d denies any chest pain or sob.  Denies any cough or cold symptoms.  Alert and oriented X4., Skin is warm and dry

## 2016-01-08 NOTE — ED Notes (Signed)
MD at bedside. 

## 2016-01-08 NOTE — ED Notes (Signed)
Pt's mother states she took patient off all meds x 3-4 day due to behavior and c/o pain.  Urine sample at bedside, dark tea colored. Sent for u/a

## 2016-01-08 NOTE — ED Notes (Signed)
Gave pt. Sandwich and drink per RN.

## 2016-01-08 NOTE — ED Notes (Signed)
Dr Zavitz at bedside  

## 2016-01-08 NOTE — Discharge Instructions (Signed)
Stop taking your cholesterol medicine, follow up with primary doctor.  If you were given medicines take as directed.  If you are on coumadin or contraceptives realize their levels and effectiveness is altered by many different medicines.  If you have any reaction (rash, tongues swelling, other) to the medicines stop taking and see a physician.    If your blood pressure was elevated in the ER make sure you follow up for management with a primary doctor or return for chest pain, shortness of breath or stroke symptoms.  Please follow up as directed and return to the ER or see a physician for new or worsening symptoms.  Thank you. Vitals:   01/08/16 1513 01/08/16 1522 01/08/16 1734 01/08/16 1842  BP: 122/75  112/69 108/69  Pulse: 94  88 72  Resp: 22  16 16   Temp: 98.2 F (36.8 C)     TempSrc: Oral     SpO2: 100%  100% 100%  Weight:  215 lb (97.5 kg)    Height:  5\' 5"  (1.651 m)

## 2016-01-16 NOTE — ED Provider Notes (Signed)
[facility]-EMERGENCY DEPT Provider Note   CSN: [id number] Arrival date & time: 01/08/16  1505     History   Chief Complaint Chief Complaint  Patient presents with  . Muscle Pain    HPI Kristi Fletcher is a [age] y.o. female.  Patient presents with diffuse muscle pain and joint pain.  Pt has had milder similar sxs but not this long of 5 days.  Only med change Zoloft approx 1 mo ago, denies cholesterol meds.  No known joint diseases. No fever or joint swelling.  No herbal supplements.  No other systemic sxs.        History reviewed. No pertinent past medical history.  There are no active problems to display for this patient.   History reviewed. No pertinent surgical history.  OB History    No data available       Home Medications    Prior to Admission medications   Medication Sig Start Date End Date Taking? Authorizing Provider  HYDROcodone-acetaminophen (NORCO/VICODIN) 5-325 MG per tablet Take 1 tablet by mouth every 6 (six) hours as needed. 12/29/12  Yes Linna HoffJames D Kindl, MD  ondansetron (ZOFRAN-ODT) 4 MG disintegrating tablet Take 1 tablet (4 mg total) by mouth every 8 (eight) hours as needed for nausea. Patient not taking: Reported on 01/08/2016 11/18/12   Gerhard Munchobert Lockwood, MD  traMADol (ULTRAM) 50 MG tablet Take 1 tablet (50 mg total) by mouth every 6 (six) hours as needed for pain. Patient not taking: Reported on 01/08/2016 11/18/12   Gerhard Munchobert Lockwood, MD    Family History No family history on file.  Social History Social History  Substance Use Topics  . Smoking status: Never Smoker  . Smokeless tobacco: Never Used  . Alcohol use No     Allergies   Patient has no known allergies.   Review of Systems Review of Systems  Constitutional: Negative for chills and fever.  HENT: Negative for congestion.   Eyes: Negative for visual disturbance.  Respiratory: Negative for shortness of breath.   Cardiovascular: Negative for chest pain.  Gastrointestinal: Negative for  abdominal pain and vomiting.  Genitourinary: Positive for hematuria. Negative for dysuria and flank pain.  Musculoskeletal: Positive for arthralgias. Negative for back pain, neck pain and neck stiffness.  Skin: Negative for rash.  Neurological: Negative for weakness, light-headedness, numbness and headaches.     Physical Exam Updated Vital Signs BP 118/79   Pulse 78   Temp 98.2 F (36.8 C) (Oral)   Resp 16   Ht 5\' 5"  (1.651 m)   Wt 215 lb (97.5 kg)   SpO2 100%   BMI 35.78 kg/m   Physical Exam  Constitutional: She is oriented to person, place, and time. She appears well-developed and well-nourished.  HENT:  Head: Normocephalic and atraumatic.  Eyes: Conjunctivae are normal. Right eye exhibits no discharge. Left eye exhibits no discharge.  Neck: Normal range of motion. Neck supple. No tracheal deviation present.  Cardiovascular: Normal rate and regular rhythm.   Pulmonary/Chest: Effort normal and breath sounds normal.  Abdominal: Soft. She exhibits no distension. There is no tenderness. There is no guarding.  Musculoskeletal: She exhibits tenderness (mild tender with movement of extremities, no joint effusions or warmth to joints, no rash, normal ROM of major joints). She exhibits no edema.  Neurological: She is alert and oriented to person, place, and time. No cranial nerve deficit.  Skin: Skin is warm. No rash noted.  Psychiatric: She has a normal mood and affect.  Nursing note and vitals  reviewed.    ED Treatments / Results  Labs (all labs ordered are listed, but only abnormal results are displayed) Labs Reviewed  COMPREHENSIVE METABOLIC PANEL - Abnormal; Notable for the following:       Result Value   AST 42 (*)    All other components within normal limits  URINALYSIS, ROUTINE W REFLEX MICROSCOPIC (NOT AT [facility]) - Abnormal; Notable for the following:    Color, Urine RED (*)    APPearance CLOUDY (*)    Hgb urine dipstick LARGE (*)    Bilirubin Urine SMALL (*)     Ketones, ur 15 (*)    Protein, ur 30 (*)    Leukocytes, UA SMALL (*)    All other components within normal limits  URINE MICROSCOPIC-ADD ON - Abnormal; Notable for the following:    Squamous Epithelial / LPF 0-5 (*)    Bacteria, UA FEW (*)    All other components within normal limits  CBC  CK    EKG  EKG Interpretation None       Radiology No results found.  Procedures Procedures (including critical care time)  Medications Ordered in ED Medications - No data to display   Initial Impression / Assessment and Plan / ED Course  I have reviewed the triage vital signs and the nursing notes.  Pertinent labs & imaging results that were available during my care of the patient were reviewed by me and considered in my medical decision making (see chart for details).  Clinical Course    Pt with body aches/ joint pains and hematuria.  No known systemic dz or cholesterol medicine.  No signs of rhabdo on blood work.  Hematuria present.   Pt well appearing in the ED.  Blood work reassuring and pt stable for close outpt fup with pcp and possible referral if sxs do not improve.   Results and differential diagnosis were discussed with the patient/parent/guardian. Xrays were independently reviewed by myself.  Close follow up outpatient was discussed, comfortable with the plan.   Medications - No data to display  Vitals:   01/08/16 1745 01/08/16 1842 01/08/16 1845 01/08/16 2000  BP: 119/63 108/69 107/68 118/79  Pulse: 69 72 72 78  Resp:  16    Temp:      TempSrc:      SpO2: 100% 100% 100% 100%  Weight:      Height:        Final diagnoses:  Muscle pain      Final Clinical Impressions(s) / ED Diagnoses   Final diagnoses:  Muscle pain    New Prescriptions Discharge Medication List as of 01/09/2016 12:13 AM       Blane OharaJoshua Addam Goeller, MD 01/16/16 [id number]

## 2016-04-18 ENCOUNTER — Telehealth (HOSPITAL_COMMUNITY): Payer: Self-pay | Admitting: Emergency Medicine

## 2016-04-18 ENCOUNTER — Encounter (HOSPITAL_COMMUNITY): Payer: Self-pay | Admitting: Family Medicine

## 2016-04-18 ENCOUNTER — Ambulatory Visit (HOSPITAL_COMMUNITY)
Admission: EM | Admit: 2016-04-18 | Discharge: 2016-04-18 | Disposition: A | Discharge status: Home / Self Care | Payer: Medicaid Other | Attending: Family Medicine | Admitting: Family Medicine

## 2016-04-18 DIAGNOSIS — K2900 Acute gastritis without bleeding: Secondary | ICD-10-CM | POA: Diagnosis not present

## 2016-04-18 DIAGNOSIS — K2100 Gastro-esophageal reflux disease with esophagitis, without bleeding: Secondary | ICD-10-CM

## 2016-04-18 MED ORDER — RANITIDINE HCL 300 MG PO TABS: 300.0000 mg | ORAL_TABLET | Freq: Every day | ORAL | 0 refills | Status: DC

## 2016-04-18 MED ORDER — OMEPRAZOLE 20 MG PO CPDR: 20.0000 mg | DELAYED_RELEASE_CAPSULE | Freq: Every day | ORAL | 0 refills | Status: DC

## 2016-04-18 NOTE — Telephone Encounter (Signed)
Patient requested medications to be sent to [facility].

## 2016-04-18 NOTE — ED Provider Notes (Signed)
CSN: [id number]     Arrival date & time 04/18/16  1413 History   None    Chief Complaint  Patient presents with  . Abdominal Pain   (Consider location/radiation/quality/duration/timing/severity/associated sxs/prior Treatment) Patient c/o epigastric abdominal discomfort and abdominal pain.   The history is provided by the patient.  Abdominal Pain  Pain location:  Generalized Pain quality: aching   Pain radiates to:  Does not radiate Pain severity:  No pain Onset quality:  Sudden Timing:  Constant Progression:  Worsening Chronicity:  New   History reviewed. No pertinent past medical history. History reviewed. No pertinent surgical history. History reviewed. No pertinent family history. Social History  Substance Use Topics  . Smoking status: Never Smoker  . Smokeless tobacco: Never Used  . Alcohol use No   OB History    No data available     Review of Systems  Constitutional: Negative.   HENT: Negative.   Eyes: Negative.   Respiratory: Negative.   Cardiovascular: Negative.   Gastrointestinal: Positive for abdominal pain.  Endocrine: Negative.   Genitourinary: Negative.   Musculoskeletal: Negative.   Skin: Negative.   Allergic/Immunologic: Negative.   Neurological: Negative.   Hematological: Negative.   Psychiatric/Behavioral: Negative.     Allergies  Patient has no known allergies.  Home Medications   Prior to Admission medications   Medication Sig Start Date End Date Taking? Authorizing Provider  omeprazole (PRILOSEC) 20 MG capsule Take 1 capsule (20 mg total) by mouth daily. 04/18/16   Deatra CanterWilliam J Sherman Donaldson, FNP  ranitidine (ZANTAC) 300 MG tablet Take 1 tablet (300 mg total) by mouth at bedtime. 04/18/16   Deatra CanterWilliam J Sparkles Mcneely, FNP   Meds Ordered and Administered this Visit  Medications - No data to display  BP 109/68   Pulse 80   Temp 98.2 F (36.8 C)   Resp 16   SpO2 100%  No data found.   Physical Exam  Constitutional: She is oriented to person,  place, and time. She appears well-developed and well-nourished.  HENT:  Head: Normocephalic and atraumatic.  Eyes: Conjunctivae and EOM are normal. Pupils are equal, round, and reactive to light.  Neck: Normal range of motion. Neck supple.  Cardiovascular: Normal rate, regular rhythm and normal heart sounds.   Pulmonary/Chest: Effort normal and breath sounds normal.  Abdominal: Soft. Bowel sounds are normal.  Musculoskeletal: Normal range of motion.  Neurological: She is alert and oriented to person, place, and time.  Nursing note and vitals reviewed.   Urgent Care Course     Procedures (including critical care time)  Labs Review Labs Reviewed - No data to display  Imaging Review No results found.   Visual Acuity Review  Right Eye Distance:   Left Eye Distance:   Bilateral Distance:    Right Eye Near:   Left Eye Near:    Bilateral Near:         MDM   1. Acute gastritis without hemorrhage, unspecified gastritis type   2. GERD with esophagitis    Zantac 300mg  po qhs #30 Prilosec 20mg  one po qd #30      Deatra CanterWilliam J Matie Dimaano, FNP 04/18/16 [phone]

## 2016-04-18 NOTE — ED Triage Notes (Addendum)
Pt here for epigastric pain, vomiting and right arm and leg pain. sts x 4 weeks. Pt has been taking nexium, meloxicam and zofran without relief.

## 2016-05-19 ENCOUNTER — Ambulatory Visit: Payer: Self-pay | Admitting: General Surgery

## 2016-05-19 NOTE — H&P (Signed)
Drue SecondKayla Zuelke 05/19/2016 2:44 PM Location: [facility] Patient #: [id number] DOB: 1990-03-26 Single / Language: [place] / Race: Black or African American Female  History of Present Illness Minerva Areola(Sandrea Boer M. Tauno Falotico MD; 05/19/2016 3:24 PM) Patient words: New-Gallbladder.  The patient is a 26 year old female who presents for evaluation of gallbladder disease. She is referred by Dr Midge AverPavelock for evaluation of gallstones. The patient has a developmental delay so history is very challenging to obtain from the patient. History comes from medical records which are limited as well as from the mother. The patient states that she started having issues about 2 months ago with upper abdominal pain. She points to her upper abdomen in the center and to the right side. She states that it radiates or moves to her back. She would have nausea and vomiting with it. Initially her symptoms were intermittent. She ended up seeing her primary care doctor who thought it might be consistent with esophagitis or reflux and was started on reflux medication. There is no improvement. The patient did end up in the emergency room at the end of February and was also felt to have gastritis. The mother states that some of her blood work as an outpatient was lost and had to be repeated. The only blood work I have is an alkaline phosphatase and a lipase both of which were normal. She ultimately underwent an ultrasound which the report is dictated in the referring doctor's note stating that she has gallstones without any evidence of cholecystitis and a mild fatty liver. Most frequently the patient states that she has not been able to keep solid food down. She can't recall the last time she had a bowel movement but is passing flatus. She also states that occasionally she feels like something gets stuck in her upper neck. She denies any melena or hematuria. She is voiding daily. Tylenol and ibuprofen has helped with the discomfort. Her  mother states that the nausea pills don't work because she throws them.   Problem List/Past Medical Minerva Areola(Kenneth Lax M. Andrey CampanileWilson, MD; 05/19/2016 3:26 PM) SYMPTOMATIC CHOLELITHIASIS (K80.20)  Diagnostic Studies History Doristine Devoid(Chemira Jones, CMA; 05/19/2016 2:44 PM) Colonoscopy never Pap Smear 1-5 years ago  Allergies Doristine Devoid(Chemira Jones, CMA; 05/19/2016 2:45 PM) No Known Drug Allergies 05/19/2016  Medication History Minerva Areola(Damali Broadfoot M. Andrey CampanileWilson, MD; 05/19/2016 3:26 PM) Medications Reconciled Ondansetron (4MG  Tablet Disint, 1 (one) Tablet Oral every eight hours, as needed, Taken starting 05/19/2016) Active. TraMADol HCl (50MG  Tablet, 1 (one) Tablet Oral every eight hours, as needed, Taken starting 05/19/2016) Active.  Social History Doristine Devoid(Chemira Jones, CMA; 05/19/2016 2:44 PM) No alcohol use No caffeine use No drug use Tobacco use Never smoker.  Family History Doristine Devoid(Chemira Jones, CMA; 05/19/2016 2:44 PM) Alcohol Abuse Family Members In General, Mother. Arthritis Family Members In General. Cerebrovascular Accident Family Members In General. Diabetes Mellitus Family Members In General. Heart Disease Family Members In General. Hypertension Family Members In General, Mother.  Pregnancy / Birth History Doristine Devoid(Chemira Jones, CMA; 05/19/2016 2:44 PM) Age at menarche 14 years. Contraceptive History Depo-provera, Oral contraceptives. Irregular periods  Other Problems Minerva Areola(Rakia Frayne M. Andrey CampanileWilson, MD; 05/19/2016 3:26 PM) Anxiety Disorder Back Pain Bladder Problems Cholelithiasis Depression Gastroesophageal Reflux Disease Seizure Disorder     Review of Systems (Chemira Jones CMA; 05/19/2016 2:44 PM) General Present- Appetite Loss and Weight Loss. Not Present- Chills, Fatigue, Fever, Night Sweats and Weight Gain. Skin Not Present- Change in Wart/Mole, Dryness, Hives, Jaundice, New Lesions, Non-Healing Wounds, Rash and Ulcer. HEENT Present- Wears glasses/contact lenses. Not Present- Earache,  Hearing Loss, Hoarseness, Nose  Bleed, Oral Ulcers, Ringing in the Ears, Seasonal Allergies, Sinus Pain, Sore Throat, Visual Disturbances and Yellow Eyes. Respiratory Not Present- Bloody sputum, Chronic Cough, Difficulty Breathing, Snoring and Wheezing. Breast Not Present- Breast Mass, Breast Pain, Nipple Discharge and Skin Changes. Cardiovascular Present- Difficulty Breathing Lying Down. Not Present- Chest Pain, Leg Cramps, Palpitations, Rapid Heart Rate, Shortness of Breath and Swelling of Extremities. Gastrointestinal Present- Abdominal Pain, Difficulty Swallowing, Indigestion, Nausea and Vomiting. Not Present- Bloating, Bloody Stool, Change in Bowel Habits, Chronic diarrhea, Constipation, Excessive gas, Gets full quickly at meals, Hemorrhoids and Rectal Pain. Female Genitourinary Not Present- Frequency, Nocturia, Painful Urination, Pelvic Pain and Urgency. Musculoskeletal Present- Back Pain and Muscle Pain. Not Present- Joint Pain, Joint Stiffness, Muscle Weakness and Swelling of Extremities. Neurological Present- Trouble walking. Not Present- Decreased Memory, Fainting, Headaches, Numbness, Seizures, Tingling, Tremor and Weakness. Psychiatric Present- Anxiety and Depression. Not Present- Bipolar, Change in Sleep Pattern, Fearful and Frequent crying. Endocrine Not Present- Cold Intolerance, Excessive Hunger, Hair Changes, Heat Intolerance, Hot flashes and New Diabetes. Hematology Not Present- Blood Thinners, Easy Bruising, Excessive bleeding, Gland problems, HIV and Persistent Infections.  Vitals (Chemira Jones CMA; 05/19/2016 2:45 PM) 05/19/2016 2:45 PM Weight: 193 lb Height: 65in Body Surface Area: 1.95 m Body Mass Index: 32.12 kg/m  Temp.: 99.70F(Oral)  Pulse: 111 (Regular)  BP: 106/70 (Sitting, Left Arm, Standard)      Physical Exam Minerva Areola M. Sereniti Wan MD; 05/19/2016 3:21 PM)  General Mental Status-Alert. General Appearance-Consistent with stated age. Hydration-Well  hydrated. Voice-Normal.  Head and Neck Head-normocephalic, atraumatic with no lesions or palpable masses. Trachea-midline. Thyroid Gland Characteristics - normal size and consistency.  Eye Eyeball - Bilateral-Extraocular movements intact. Sclera/Conjunctiva - Bilateral-No scleral icterus.  Chest and Lung Exam Chest and lung exam reveals -quiet, even and easy respiratory effort with no use of accessory muscles and on auscultation, normal breath sounds, no adventitious sounds and normal vocal resonance. Inspection Chest Wall - Normal. Back - normal.  Breast - Did not examine.  Cardiovascular Cardiovascular examination reveals -normal heart sounds, regular rate and rhythm with no murmurs and normal pedal pulses bilaterally.  Abdomen Inspection Inspection of the abdomen reveals - No Hernias. Skin - Scar - no surgical scars. Palpation/Percussion Palpation and Percussion of the abdomen reveal - Soft, Non Tender, No Rebound tenderness, No Rigidity (guarding) and No hepatosplenomegaly. Auscultation Auscultation of the abdomen reveals - Bowel sounds normal.  Peripheral Vascular Upper Extremity Palpation - Pulses bilaterally normal.  Neurologic Neurologic evaluation reveals -alert and oriented x 3 with no impairment of recent or remote memory. Mental Status-Normal.  Neuropsychiatric The patient's mood and affect are described as -normal. Judgment and Insight-there is a lack of insight concerning matters relevant to self.  Musculoskeletal Normal Exam - Left-Upper Extremity Strength Normal and Lower Extremity Strength Normal. Normal Exam - Right-Upper Extremity Strength Normal and Lower Extremity Strength Normal.  Lymphatic Head & Neck  General Head & Neck Lymphatics: Bilateral - Description - Normal. Axillary - Did not examine. Femoral & Inguinal - Did not examine.    Assessment & Plan Minerva Areola M. Avyukt Cimo MD; 05/19/2016 3:26 PM)  SYMPTOMATIC  CHOLELITHIASIS (K80.20) Impression: I believe the majority of the patient's symptoms are consistent with gallbladder disease. There was no improvement in symptoms with PPI. She does not appear ill. There are no clinical signs of dehydration. Abdomen is soft and nontender. Therefore I do not think this is a surgical emergency or she needs to be admitted to the hospital. She  was given a prescription for tramadol and oral disintegrating Zofran.  We discussed gallbladder disease. The patient was given educational material. We discussed non-operative and operative management. We discussed the signs & symptoms of acute cholecystitis  I discussed laparoscopic cholecystectomy with IOC in detail. The patient was given educational material as well as diagrams detailing the procedure. We discussed the risks and benefits of a laparoscopic cholecystectomy including, but not limited to bleeding, infection, injury to surrounding structures such as the intestine or liver, bile leak, retained gallstones, need to convert to an open procedure, prolonged diarrhea, blood clots such as DVT, common bile duct injury, anesthesia risks, and possible need for additional procedures. We discussed the typical post-operative recovery course. I explained that the likelihood of improvement of their symptoms is good.  We did discuss that the sensation of food getting stuck would not get better after gallbladder surgery; however, I am not sure she truly has an esophageal issue since history is somewhat difficult to obtain from the patient and would be very atypical for someone her age.  The patient & mother have elected to proceed with surgery.  Current Plans Pt Education - Pamphlet Given - Laparoscopic Gallbladder Surgery: discussed with patient and provided information. You are being scheduled for surgery- Our schedulers will call you.  You should hear from our office's scheduling department within 5 working days about the  location, date, and time of surgery. We try to make accommodations for patient's preferences in scheduling surgery, but sometimes the OR schedule or the surgeon's schedule prevents us from making those accommodations.  If you have not heard from our office ([phone]) in 5 working days, call the office and ask for your surgeon's nurse.  If you have other questions about your diagnosis, plan, or surgery, call the office and ask for your surgeon's nurse.  Started Ondansetron 4MG, 1 (one) Tablet every eight hours, as needed, #20, 05/19/2016, No Refill. Started TraMADol HCl 50MG, 1 (one) Tablet every eight hours, as needed, #15, 05/19/2016, No Refill. Pt Education - NCCSRS: no at risk use: discussed with patient and provided information.  Shriyan Arakawa M. Yumiko Alkins, MD, FACS General, Bariatric, & Minimally Invasive Surgery [facility]  

## 2016-05-24 ENCOUNTER — Encounter (HOSPITAL_COMMUNITY): Payer: Self-pay | Admitting: *Deleted

## 2016-05-25 MED ORDER — CEFOTETAN DISODIUM-DEXTROSE 2-2.08 GM-% IV SOLR
2.0000 g | INTRAVENOUS | Status: AC
  Administered 2016-05-26: 2 g via INTRAVENOUS
  Filled 2016-05-25: qty 50

## 2016-05-25 MED ORDER — GABAPENTIN 300 MG PO CAPS
300.0000 mg | ORAL_CAPSULE | ORAL | Status: AC
  Administered 2016-05-26: 300 mg via ORAL
  Filled 2016-05-25: qty 1

## 2016-05-25 MED ORDER — ACETAMINOPHEN 500 MG PO TABS
1000.0000 mg | ORAL_TABLET | ORAL | Status: AC
  Administered 2016-05-26: 1000 mg via ORAL
  Filled 2016-05-25: qty 2

## 2016-05-26 ENCOUNTER — Encounter (HOSPITAL_COMMUNITY)
Admission: RE | Disposition: A | Discharge status: Home / Self Care | Payer: Self-pay | Source: Ambulatory Visit | Attending: General Surgery

## 2016-05-26 ENCOUNTER — Ambulatory Visit (HOSPITAL_COMMUNITY): Payer: Medicaid Other

## 2016-05-26 ENCOUNTER — Ambulatory Visit (HOSPITAL_COMMUNITY)
Admission: RE | Admit: 2016-05-26 | Discharge: 2016-05-26 | Disposition: A | Discharge status: Home / Self Care | Payer: Medicaid Other | Source: Ambulatory Visit | Attending: General Surgery | Admitting: General Surgery

## 2016-05-26 ENCOUNTER — Ambulatory Visit (HOSPITAL_COMMUNITY): Payer: Medicaid Other | Admitting: Anesthesiology

## 2016-05-26 ENCOUNTER — Encounter (HOSPITAL_COMMUNITY): Payer: Self-pay | Admitting: *Deleted

## 2016-05-26 DIAGNOSIS — G40909 Epilepsy, unspecified, not intractable, without status epilepticus: Secondary | ICD-10-CM | POA: Diagnosis not present

## 2016-05-26 DIAGNOSIS — K8080 Other cholelithiasis without obstruction: Secondary | ICD-10-CM | POA: Diagnosis present

## 2016-05-26 DIAGNOSIS — K8010 Calculus of gallbladder with chronic cholecystitis without obstruction: Secondary | ICD-10-CM | POA: Diagnosis not present

## 2016-05-26 DIAGNOSIS — Z79891 Long term (current) use of opiate analgesic: Secondary | ICD-10-CM | POA: Diagnosis not present

## 2016-05-26 DIAGNOSIS — Z791 Long term (current) use of non-steroidal anti-inflammatories (NSAID): Secondary | ICD-10-CM | POA: Diagnosis not present

## 2016-05-26 DIAGNOSIS — Z419 Encounter for procedure for purposes other than remedying health state, unspecified: Secondary | ICD-10-CM

## 2016-05-26 DIAGNOSIS — Z793 Long term (current) use of hormonal contraceptives: Secondary | ICD-10-CM | POA: Diagnosis not present

## 2016-05-26 DIAGNOSIS — Z79899 Other long term (current) drug therapy: Secondary | ICD-10-CM | POA: Diagnosis not present

## 2016-05-26 DIAGNOSIS — N329 Bladder disorder, unspecified: Secondary | ICD-10-CM | POA: Diagnosis not present

## 2016-05-26 HISTORY — DX: Developmental disorder of scholastic skills, unspecified: F81.9

## 2016-05-26 HISTORY — DX: Anxiety disorder, unspecified: F41.9

## 2016-05-26 HISTORY — DX: Gastro-esophageal reflux disease without esophagitis: K21.9

## 2016-05-26 HISTORY — DX: Major depressive disorder, single episode, unspecified: F32.9

## 2016-05-26 HISTORY — DX: Other symptoms and signs involving cognitive functions and awareness: R41.89

## 2016-05-26 HISTORY — DX: Depression, unspecified: F32.A

## 2016-05-26 HISTORY — PX: CHOLECYSTECTOMY: SHX55

## 2016-05-26 LAB — COMPREHENSIVE METABOLIC PANEL
ALT: 115 U/L — AB (ref 14–54)
ANION GAP: 12 (ref 5–15)
AST: 84 U/L — ABNORMAL HIGH (ref 15–41)
Albumin: 4 g/dL (ref 3.5–5.0)
Alkaline Phosphatase: 102 U/L (ref 38–126)
BUN: 10 mg/dL (ref 6–20)
CHLORIDE: 105 mmol/L (ref 101–111)
CO2: 21 mmol/L — AB (ref 22–32)
Calcium: 9.7 mg/dL (ref 8.9–10.3)
Creatinine, Ser: 0.74 mg/dL (ref 0.44–1.00)
GFR calc non Af Amer: >60 mL/min (ref >60)
Glucose, Bld: 82 mg/dL (ref 65–99)
Potassium: 3.3 mmol/L — ABNORMAL LOW (ref 3.5–5.1)
SODIUM: 138 mmol/L (ref 135–145)
Total Bilirubin: 0.9 mg/dL (ref 0.3–1.2)
Total Protein: 7.3 g/dL (ref 6.5–8.1)

## 2016-05-26 LAB — CBC WITH DIFFERENTIAL/PLATELET
Basophils Absolute: 0 10*3/uL (ref 0.0–0.1)
Basophils Relative: 0 %
EOS ABS: 0 10*3/uL (ref 0.0–0.7)
EOS PCT: 0 %
HCT: 38.1 % (ref 36.0–46.0)
Hemoglobin: 12.8 g/dL (ref 12.0–15.0)
LYMPHS ABS: 2 10*3/uL (ref 0.7–4.0)
Lymphocytes Relative: 38 %
MCH: 28.1 pg (ref 26.0–34.0)
MCHC: 33.6 g/dL (ref 30.0–36.0)
MCV: 83.6 fL (ref 78.0–100.0)
Monocytes Absolute: 0.4 10*3/uL (ref 0.1–1.0)
Monocytes Relative: 7 %
Neutro Abs: 2.9 10*3/uL (ref 1.7–7.7)
Neutrophils Relative %: 55 %
PLATELETS: 248 10*3/uL (ref 150–400)
RBC: 4.56 MIL/uL (ref 3.87–5.11)
RDW: 12.7 % (ref 11.5–15.5)
WBC: 5.3 10*3/uL (ref 4.0–10.5)

## 2016-05-26 LAB — PREGNANCY, URINE: PREG TEST UR: NEGATIVE

## 2016-05-26 SURGERY — LAPAROSCOPIC CHOLECYSTECTOMY WITH INTRAOPERATIVE CHOLANGIOGRAM
Anesthesia: General | Site: Abdomen

## 2016-05-26 MED ORDER — FENTANYL CITRATE (PF) 250 MCG/5ML IJ SOLN
INTRAMUSCULAR | Status: AC
  Filled 2016-05-26: qty 5

## 2016-05-26 MED ORDER — ONDANSETRON HCL 4 MG/2ML IJ SOLN
INTRAMUSCULAR | Status: DC | PRN
  Administered 2016-05-26: 4 mg via INTRAVENOUS

## 2016-05-26 MED ORDER — NEOSTIGMINE METHYLSULFATE 5 MG/5ML IV SOSY
PREFILLED_SYRINGE | INTRAVENOUS | Status: AC
  Filled 2016-05-26: qty 5

## 2016-05-26 MED ORDER — CHLORHEXIDINE GLUCONATE CLOTH 2 % EX PADS: 6.0000 | MEDICATED_PAD | Freq: Once | CUTANEOUS | Status: DC

## 2016-05-26 MED ORDER — PROPOFOL 10 MG/ML IV BOLUS
INTRAVENOUS | Status: DC | PRN
  Administered 2016-05-26: 40 mg via INTRAVENOUS
  Administered 2016-05-26: 150 mg via INTRAVENOUS

## 2016-05-26 MED ORDER — HYDROMORPHONE HCL 1 MG/ML IJ SOLN
INTRAMUSCULAR | Status: AC
  Filled 2016-05-26: qty 0.5

## 2016-05-26 MED ORDER — PHENYLEPHRINE HCL 10 MG/ML IJ SOLN
INTRAMUSCULAR | Status: DC | PRN
  Administered 2016-05-26: 80 ug via INTRAVENOUS
  Administered 2016-05-26: 120 ug via INTRAVENOUS

## 2016-05-26 MED ORDER — PHENYLEPHRINE 40 MCG/ML (10ML) SYRINGE FOR IV PUSH (FOR BLOOD PRESSURE SUPPORT)
PREFILLED_SYRINGE | INTRAVENOUS | Status: AC
  Filled 2016-05-26: qty 10

## 2016-05-26 MED ORDER — KETOROLAC TROMETHAMINE 30 MG/ML IJ SOLN
INTRAMUSCULAR | Status: DC | PRN
  Administered 2016-05-26: 30 mg via INTRAVENOUS

## 2016-05-26 MED ORDER — ONDANSETRON HCL 4 MG/2ML IJ SOLN
INTRAMUSCULAR | Status: AC
  Filled 2016-05-26: qty 2

## 2016-05-26 MED ORDER — GLYCOPYRROLATE 0.2 MG/ML IJ SOLN
INTRAMUSCULAR | Status: DC | PRN
  Administered 2016-05-26: 0.6 mg via INTRAVENOUS

## 2016-05-26 MED ORDER — BUPIVACAINE HCL (PF) 0.25 % IJ SOLN
INTRAMUSCULAR | Status: DC | PRN
  Administered 2016-05-26: 17 mL

## 2016-05-26 MED ORDER — 0.9 % SODIUM CHLORIDE (POUR BTL) OPTIME
TOPICAL | Status: DC | PRN
  Administered 2016-05-26: 1000 mL

## 2016-05-26 MED ORDER — OXYCODONE HCL 5 MG PO TABS: 5.0000 mg | ORAL_TABLET | Freq: Four times a day (QID) | ORAL | 0 refills | Status: DC | PRN

## 2016-05-26 MED ORDER — STERILE WATER FOR IRRIGATION IR SOLN
Status: DC | PRN
  Administered 2016-05-26: 1000 mL

## 2016-05-26 MED ORDER — LACTATED RINGERS IV SOLN
INTRAVENOUS | Status: DC
  Administered 2016-05-26 (×2): via INTRAVENOUS

## 2016-05-26 MED ORDER — MEPERIDINE HCL 25 MG/ML IJ SOLN: 6.2500 mg | INTRAMUSCULAR | Status: DC | PRN

## 2016-05-26 MED ORDER — ROCURONIUM BROMIDE 50 MG/5ML IV SOSY
PREFILLED_SYRINGE | INTRAVENOUS | Status: AC
  Filled 2016-05-26: qty 5

## 2016-05-26 MED ORDER — ROCURONIUM BROMIDE 100 MG/10ML IV SOLN
INTRAVENOUS | Status: DC | PRN
  Administered 2016-05-26: 15 mg via INTRAVENOUS
  Administered 2016-05-26: 35 mg via INTRAVENOUS

## 2016-05-26 MED ORDER — PROPOFOL 10 MG/ML IV BOLUS
INTRAVENOUS | Status: AC
  Filled 2016-05-26: qty 20

## 2016-05-26 MED ORDER — HYDROMORPHONE HCL 1 MG/ML IJ SOLN
0.2500 mg | INTRAMUSCULAR | Status: DC | PRN
  Administered 2016-05-26 (×2): 0.25 mg via INTRAVENOUS

## 2016-05-26 MED ORDER — LIDOCAINE 2% (20 MG/ML) 5 ML SYRINGE
INTRAMUSCULAR | Status: AC
  Filled 2016-05-26: qty 5

## 2016-05-26 MED ORDER — SODIUM CHLORIDE 0.9 % IR SOLN
Status: DC | PRN
  Administered 2016-05-26: 1000 mL

## 2016-05-26 MED ORDER — MIDAZOLAM HCL 2 MG/2ML IJ SOLN
INTRAMUSCULAR | Status: AC
  Filled 2016-05-26: qty 2

## 2016-05-26 MED ORDER — BUPIVACAINE HCL (PF) 0.25 % IJ SOLN
INTRAMUSCULAR | Status: AC
  Filled 2016-05-26: qty 30

## 2016-05-26 MED ORDER — ONDANSETRON HCL 4 MG/2ML IJ SOLN: 4.0000 mg | Freq: Once | INTRAMUSCULAR | Status: DC | PRN

## 2016-05-26 MED ORDER — FENTANYL CITRATE (PF) 100 MCG/2ML IJ SOLN
INTRAMUSCULAR | Status: DC | PRN
  Administered 2016-05-26 (×5): 50 ug via INTRAVENOUS

## 2016-05-26 MED ORDER — NEOSTIGMINE METHYLSULFATE 10 MG/10ML IV SOLN
INTRAVENOUS | Status: DC | PRN
  Administered 2016-05-26: 4 mg via INTRAVENOUS

## 2016-05-26 MED ORDER — IOPAMIDOL (ISOVUE-300) INJECTION 61%
INTRAVENOUS | Status: DC | PRN
  Administered 2016-05-26: 5 mL

## 2016-05-26 MED ORDER — LIDOCAINE HCL (CARDIAC) 20 MG/ML IV SOLN
INTRAVENOUS | Status: DC | PRN
  Administered 2016-05-26: 100 mg via INTRAVENOUS

## 2016-05-26 MED ORDER — IOPAMIDOL (ISOVUE-300) INJECTION 61%
INTRAVENOUS | Status: AC
  Filled 2016-05-26: qty 50

## 2016-05-26 SURGICAL SUPPLY — 52 items
APPLIER CLIP 5 13 M/L LIGAMAX5 (MISCELLANEOUS) ×3
BANDAGE ADH SHEER 1  50/CT (GAUZE/BANDAGES/DRESSINGS) ×9 IMPLANT
BENZOIN TINCTURE PRP APPL 2/3 (GAUZE/BANDAGES/DRESSINGS) IMPLANT
BLADE CLIPPER SURG (BLADE) ×3 IMPLANT
BNDG COHESIVE 2X5 WHT NS (GAUZE/BANDAGES/DRESSINGS) ×9 IMPLANT
CANISTER SUCT 3000ML PPV (MISCELLANEOUS) ×3 IMPLANT
CHLORAPREP W/TINT 26ML (MISCELLANEOUS) ×3 IMPLANT
CLIP APPLIE 5 13 M/L LIGAMAX5 (MISCELLANEOUS) ×1 IMPLANT
CLOSURE WOUND 1/2 X4 (GAUZE/BANDAGES/DRESSINGS) ×1
COVER MAYO STAND STRL (DRAPES) ×3 IMPLANT
COVER SURGICAL LIGHT HANDLE (MISCELLANEOUS) ×3 IMPLANT
DRAPE C-ARM 42X72 X-RAY (DRAPES) ×3 IMPLANT
DRAPE UTILITY XL STRL (DRAPES) ×3 IMPLANT
DRSG TEGADERM 4X4.75 (GAUZE/BANDAGES/DRESSINGS) ×3 IMPLANT
ELECT REM PT RETURN 9FT ADLT (ELECTROSURGICAL) ×3
ELECTRODE REM PT RTRN 9FT ADLT (ELECTROSURGICAL) ×1 IMPLANT
GAUZE SPONGE 2X2 8PLY STRL LF (GAUZE/BANDAGES/DRESSINGS) ×1 IMPLANT
GLOVE BIO SURGEON STRL SZ7.5 (GLOVE) ×6 IMPLANT
GLOVE BIOGEL M STRL SZ7.5 (GLOVE) ×6 IMPLANT
GLOVE BIOGEL PI IND STRL 8 (GLOVE) ×3 IMPLANT
GLOVE BIOGEL PI INDICATOR 8 (GLOVE) ×6
GLOVE ECLIPSE 7.5 STRL STRAW (GLOVE) ×3 IMPLANT
GLOVE INDICATOR 7.5 STRL GRN (GLOVE) ×3 IMPLANT
GLOVE SURG SS PI 6.5 STRL IVOR (GLOVE) ×3 IMPLANT
GOWN STRL REUS W/ TWL LRG LVL3 (GOWN DISPOSABLE) ×3 IMPLANT
GOWN STRL REUS W/ TWL XL LVL3 (GOWN DISPOSABLE) ×1 IMPLANT
GOWN STRL REUS W/TWL 2XL LVL3 (GOWN DISPOSABLE) ×3 IMPLANT
GOWN STRL REUS W/TWL LRG LVL3 (GOWN DISPOSABLE) ×6
GOWN STRL REUS W/TWL XL LVL3 (GOWN DISPOSABLE) ×2
GRASPER SUT TROCAR 14GX15 (MISCELLANEOUS) IMPLANT
KIT BASIN OR (CUSTOM PROCEDURE TRAY) ×3 IMPLANT
KIT ROOM TURNOVER OR (KITS) ×3 IMPLANT
LIQUID BAND (GAUZE/BANDAGES/DRESSINGS) ×3 IMPLANT
NS IRRIG 1000ML POUR BTL (IV SOLUTION) ×3 IMPLANT
PAD ARMBOARD 7.5X6 YLW CONV (MISCELLANEOUS) ×3 IMPLANT
POUCH RETRIEVAL ECOSAC 10 (ENDOMECHANICALS) ×1 IMPLANT
POUCH RETRIEVAL ECOSAC 10MM (ENDOMECHANICALS) ×2
SCISSORS LAP 5X35 DISP (ENDOMECHANICALS) ×3 IMPLANT
SET CHOLANGIOGRAPH 5 50 .035 (SET/KITS/TRAYS/PACK) ×3 IMPLANT
SET IRRIG TUBING LAPAROSCOPIC (IRRIGATION / IRRIGATOR) ×3 IMPLANT
SLEEVE ENDOPATH XCEL 5M (ENDOMECHANICALS) ×6 IMPLANT
SPECIMEN JAR SMALL (MISCELLANEOUS) ×3 IMPLANT
SPONGE GAUZE 2X2 STER 10/PKG (GAUZE/BANDAGES/DRESSINGS) ×2
STRIP CLOSURE SKIN 1/2X4 (GAUZE/BANDAGES/DRESSINGS) ×2 IMPLANT
SUT MNCRL AB 4-0 PS2 18 (SUTURE) ×3 IMPLANT
SUT VICRYL 0 UR6 27IN ABS (SUTURE) ×3 IMPLANT
TOWEL OR 17X24 6PK STRL BLUE (TOWEL DISPOSABLE) ×3 IMPLANT
TOWEL OR 17X26 10 PK STRL BLUE (TOWEL DISPOSABLE) IMPLANT
TRAY LAPAROSCOPIC MC (CUSTOM PROCEDURE TRAY) ×3 IMPLANT
TROCAR XCEL BLUNT TIP 100MML (ENDOMECHANICALS) ×3 IMPLANT
TROCAR XCEL NON-BLD 5MMX100MML (ENDOMECHANICALS) ×3 IMPLANT
TUBING INSUFFLATION (TUBING) ×3 IMPLANT

## 2016-05-26 NOTE — Transfer of Care (Signed)
Immediate Anesthesia Transfer of Care Note  Patient: Kristi Fletcher  Procedure(s) Performed: Procedure(s): LAPAROSCOPIC CHOLECYSTECTOMY WITH POSSIBLE NTRAOPERATIVE CHOLANGIOGRAM (N/A)  Patient Location: PACU  Anesthesia Type:General  Level of Consciousness: awake and alert   Airway & Oxygen Therapy: Patient Spontanous Breathing and Patient connected to nasal cannula oxygen  Post-op Assessment: Report given to RN and Post -op Vital signs reviewed and stable  Post vital signs: Reviewed and stable  Last Vitals:  Vitals:   05/26/16 0938 05/26/16 1455  BP: 122/74   Pulse: 64   Resp: 18   Temp: 36.6 C 36.6 C    Last Pain:  Vitals:   05/26/16 1003  TempSrc:   PainSc: 3       Patients Stated Pain Goal: 1 (05/26/16 1003)  Complications: No apparent anesthesia complications

## 2016-05-26 NOTE — H&P (View-Only) (Signed)
Drue SecondKayla Zuelke 05/19/2016 2:44 PM Location: [facility] Patient #: [id number] DOB: 1990-03-26 Single / Language: [place] / Race: Black or African American Female  History of Present Illness Minerva Areola(Loni Delbridge M. Annibelle Brazie MD; 05/19/2016 3:24 PM) Patient words: New-Gallbladder.  The patient is a 26 year old female who presents for evaluation of gallbladder disease. She is referred by Dr Midge AverPavelock for evaluation of gallstones. The patient has a developmental delay so history is very challenging to obtain from the patient. History comes from medical records which are limited as well as from the mother. The patient states that she started having issues about 2 months ago with upper abdominal pain. She points to her upper abdomen in the center and to the right side. She states that it radiates or moves to her back. She would have nausea and vomiting with it. Initially her symptoms were intermittent. She ended up seeing her primary care doctor who thought it might be consistent with esophagitis or reflux and was started on reflux medication. There is no improvement. The patient did end up in the emergency room at the end of February and was also felt to have gastritis. The mother states that some of her blood work as an outpatient was lost and had to be repeated. The only blood work I have is an alkaline phosphatase and a lipase both of which were normal. She ultimately underwent an ultrasound which the report is dictated in the referring doctor's note stating that she has gallstones without any evidence of cholecystitis and a mild fatty liver. Most frequently the patient states that she has not been able to keep solid food down. She can't recall the last time she had a bowel movement but is passing flatus. She also states that occasionally she feels like something gets stuck in her upper neck. She denies any melena or hematuria. She is voiding daily. Tylenol and ibuprofen has helped with the discomfort. Her  mother states that the nausea pills don't work because she throws them.   Problem List/Past Medical Minerva Areola(Aeralyn Barna M. Andrey CampanileWilson, MD; 05/19/2016 3:26 PM) SYMPTOMATIC CHOLELITHIASIS (K80.20)  Diagnostic Studies History Doristine Devoid(Chemira Jones, CMA; 05/19/2016 2:44 PM) Colonoscopy never Pap Smear 1-5 years ago  Allergies Doristine Devoid(Chemira Jones, CMA; 05/19/2016 2:45 PM) No Known Drug Allergies 05/19/2016  Medication History Minerva Areola(Sena Hoopingarner M. Andrey CampanileWilson, MD; 05/19/2016 3:26 PM) Medications Reconciled Ondansetron (4MG  Tablet Disint, 1 (one) Tablet Oral every eight hours, as needed, Taken starting 05/19/2016) Active. TraMADol HCl (50MG  Tablet, 1 (one) Tablet Oral every eight hours, as needed, Taken starting 05/19/2016) Active.  Social History Doristine Devoid(Chemira Jones, CMA; 05/19/2016 2:44 PM) No alcohol use No caffeine use No drug use Tobacco use Never smoker.  Family History Doristine Devoid(Chemira Jones, CMA; 05/19/2016 2:44 PM) Alcohol Abuse Family Members In General, Mother. Arthritis Family Members In General. Cerebrovascular Accident Family Members In General. Diabetes Mellitus Family Members In General. Heart Disease Family Members In General. Hypertension Family Members In General, Mother.  Pregnancy / Birth History Doristine Devoid(Chemira Jones, CMA; 05/19/2016 2:44 PM) Age at menarche 14 years. Contraceptive History Depo-provera, Oral contraceptives. Irregular periods  Other Problems Minerva Areola(Reinette Cuneo M. Andrey CampanileWilson, MD; 05/19/2016 3:26 PM) Anxiety Disorder Back Pain Bladder Problems Cholelithiasis Depression Gastroesophageal Reflux Disease Seizure Disorder     Review of Systems (Chemira Jones CMA; 05/19/2016 2:44 PM) General Present- Appetite Loss and Weight Loss. Not Present- Chills, Fatigue, Fever, Night Sweats and Weight Gain. Skin Not Present- Change in Wart/Mole, Dryness, Hives, Jaundice, New Lesions, Non-Healing Wounds, Rash and Ulcer. HEENT Present- Wears glasses/contact lenses. Not Present- Earache,  Hearing Loss, Hoarseness, Nose  Bleed, Oral Ulcers, Ringing in the Ears, Seasonal Allergies, Sinus Pain, Sore Throat, Visual Disturbances and Yellow Eyes. Respiratory Not Present- Bloody sputum, Chronic Cough, Difficulty Breathing, Snoring and Wheezing. Breast Not Present- Breast Mass, Breast Pain, Nipple Discharge and Skin Changes. Cardiovascular Present- Difficulty Breathing Lying Down. Not Present- Chest Pain, Leg Cramps, Palpitations, Rapid Heart Rate, Shortness of Breath and Swelling of Extremities. Gastrointestinal Present- Abdominal Pain, Difficulty Swallowing, Indigestion, Nausea and Vomiting. Not Present- Bloating, Bloody Stool, Change in Bowel Habits, Chronic diarrhea, Constipation, Excessive gas, Gets full quickly at meals, Hemorrhoids and Rectal Pain. Female Genitourinary Not Present- Frequency, Nocturia, Painful Urination, Pelvic Pain and Urgency. Musculoskeletal Present- Back Pain and Muscle Pain. Not Present- Joint Pain, Joint Stiffness, Muscle Weakness and Swelling of Extremities. Neurological Present- Trouble walking. Not Present- Decreased Memory, Fainting, Headaches, Numbness, Seizures, Tingling, Tremor and Weakness. Psychiatric Present- Anxiety and Depression. Not Present- Bipolar, Change in Sleep Pattern, Fearful and Frequent crying. Endocrine Not Present- Cold Intolerance, Excessive Hunger, Hair Changes, Heat Intolerance, Hot flashes and New Diabetes. Hematology Not Present- Blood Thinners, Easy Bruising, Excessive bleeding, Gland problems, HIV and Persistent Infections.  Vitals (Chemira Jones CMA; 05/19/2016 2:45 PM) 05/19/2016 2:45 PM Weight: 193 lb Height: 65in Body Surface Area: 1.95 m Body Mass Index: 32.12 kg/m  Temp.: 99.70F(Oral)  Pulse: 111 (Regular)  BP: 106/70 (Sitting, Left Arm, Standard)      Physical Exam Minerva Areola M. Leovanni Bjorkman MD; 05/19/2016 3:21 PM)  General Mental Status-Alert. General Appearance-Consistent with stated age. Hydration-Well  hydrated. Voice-Normal.  Head and Neck Head-normocephalic, atraumatic with no lesions or palpable masses. Trachea-midline. Thyroid Gland Characteristics - normal size and consistency.  Eye Eyeball - Bilateral-Extraocular movements intact. Sclera/Conjunctiva - Bilateral-No scleral icterus.  Chest and Lung Exam Chest and lung exam reveals -quiet, even and easy respiratory effort with no use of accessory muscles and on auscultation, normal breath sounds, no adventitious sounds and normal vocal resonance. Inspection Chest Wall - Normal. Back - normal.  Breast - Did not examine.  Cardiovascular Cardiovascular examination reveals -normal heart sounds, regular rate and rhythm with no murmurs and normal pedal pulses bilaterally.  Abdomen Inspection Inspection of the abdomen reveals - No Hernias. Skin - Scar - no surgical scars. Palpation/Percussion Palpation and Percussion of the abdomen reveal - Soft, Non Tender, No Rebound tenderness, No Rigidity (guarding) and No hepatosplenomegaly. Auscultation Auscultation of the abdomen reveals - Bowel sounds normal.  Peripheral Vascular Upper Extremity Palpation - Pulses bilaterally normal.  Neurologic Neurologic evaluation reveals -alert and oriented x 3 with no impairment of recent or remote memory. Mental Status-Normal.  Neuropsychiatric The patient's mood and affect are described as -normal. Judgment and Insight-there is a lack of insight concerning matters relevant to self.  Musculoskeletal Normal Exam - Left-Upper Extremity Strength Normal and Lower Extremity Strength Normal. Normal Exam - Right-Upper Extremity Strength Normal and Lower Extremity Strength Normal.  Lymphatic Head & Neck  General Head & Neck Lymphatics: Bilateral - Description - Normal. Axillary - Did not examine. Femoral & Inguinal - Did not examine.    Assessment & Plan Minerva Areola M. Manraj Yeo MD; 05/19/2016 3:26 PM)  SYMPTOMATIC  CHOLELITHIASIS (K80.20) Impression: I believe the majority of the patient's symptoms are consistent with gallbladder disease. There was no improvement in symptoms with PPI. She does not appear ill. There are no clinical signs of dehydration. Abdomen is soft and nontender. Therefore I do not think this is a surgical emergency or she needs to be admitted to the hospital. She  was given a prescription for tramadol and oral disintegrating Zofran.  We discussed gallbladder disease. The patient was given Agricultural engineer. We discussed non-operative and operative management. We discussed the signs & symptoms of acute cholecystitis  I discussed laparoscopic cholecystectomy with IOC in detail. The patient was given educational material as well as diagrams detailing the procedure. We discussed the risks and benefits of a laparoscopic cholecystectomy including, but not limited to bleeding, infection, injury to surrounding structures such as the intestine or liver, bile leak, retained gallstones, need to convert to an open procedure, prolonged diarrhea, blood clots such as DVT, common bile duct injury, anesthesia risks, and possible need for additional procedures. We discussed the typical post-operative recovery course. I explained that the likelihood of improvement of their symptoms is good.  We did discuss that the sensation of food getting stuck would not get better after gallbladder surgery; however, I am not sure she truly has an esophageal issue since history is somewhat difficult to obtain from the patient and would be very atypical for someone her age.  The patient & mother have elected to proceed with surgery.  Current Plans Pt Education - Pamphlet Given - Laparoscopic Gallbladder Surgery: discussed with patient and provided information. You are being scheduled for surgery- Our schedulers will call you.  You should hear from our office's scheduling department within 5 working days about the  location, date, and time of surgery. We try to make accommodations for patient's preferences in scheduling surgery, but sometimes the OR schedule or the surgeon's schedule prevents [place] from making those accommodations.  If you have not heard from our office [phone]) in 5 working days, call the office and ask for your surgeon's nurse.  If you have other questions about your diagnosis, plan, or surgery, call the office and ask for your surgeon's nurse.  Started Ondansetron , 1 (one) Tablet every eight hours, as needed, #20, 05/19/2016, No Refill. Started TraMADol HCl , 1 (one) Tablet every eight hours, as needed, #15, 05/19/2016, No Refill. Pt Education - NCCSRS: no at risk use: discussed with patient and provided information.  Mary Sella. Andrey Campanile, MD, FACS General, Bariatric, & Minimally Invasive Surgery [facility], [place]

## 2016-05-26 NOTE — Op Note (Signed)
Kristi Fletcher [id number] 09/03/1990 05/26/2016  Laparoscopic Cholecystectomy with IOC Procedure Note  Indications: This patient presents with symptomatic gallbladder disease and will undergo laparoscopic cholecystectomy.  Pre-operative Diagnosis: symptomatic cholelithiasis  Post-operative Diagnosis: chronic cholecystitis with calculous  Surgeon: Atilano Ina   Assistants: Myrtie Soman, RNFA; Carmelina Peal PA-S  Anesthesia: General endotracheal anesthesia  Procedure Details  The patient was seen again in the Holding Room. The risks, benefits, complications, treatment options, and expected outcomes were discussed with the patient. The possibilities of reaction to medication, pulmonary aspiration, perforation of viscus, bleeding, recurrent infection, finding a normal gallbladder, the need for additional procedures, failure to diagnose a condition, the possible need to convert to an open procedure, and creating a complication requiring transfusion or operation were discussed with the patient. The likelihood of improving the patient's symptoms with return to their baseline status is good.  The patient and/or family concurred with the proposed plan, giving informed consent. The site of surgery properly noted. The patient was taken to Operating Room, identified as Kristi Fletcher and the procedure verified as Laparoscopic Cholecystectomy with Intraoperative Cholangiogram. A Time Out was held and the above information confirmed. Antibiotic prophylaxis was administered.   Prior to the induction of general anesthesia, antibiotic prophylaxis was administered. General endotracheal anesthesia was then administered and tolerated well. After the induction, the abdomen was prepped with Chloraprep and draped in the sterile fashion. The patient was positioned in the supine position.  Local anesthetic agent was injected into the skin near the umbilicus and an incision made. We dissected down to the abdominal  fascia with blunt dissection.  The fascia was incised vertically and we entered the peritoneal cavity bluntly.  A pursestring suture of 0-Vicryl was placed around the fascial opening.  The Hasson cannula was inserted and secured with the stay suture.  Pneumoperitoneum was then created with CO2 and tolerated well without any adverse changes in the patient's vital signs. An 5-mm port was placed in the subxiphoid position.  Two 5-mm ports were placed in the right upper quadrant. All skin incisions were infiltrated with a local anesthetic agent before making the incision and placing the trocars.   We positioned the patient in reverse Trendelenburg, tilted slightly to the patient's left.  The gallbladder was identified, the fundus grasped and retracted cephalad. Adhesions were lysed bluntly and with the electrocautery where indicated, taking care not to injure any adjacent organs or viscus. The infundibulum was grasped and retracted laterally, exposing the peritoneum overlying the triangle of Calot. This was then divided and exposed in a blunt fashion. A critical view of the cystic duct and cystic artery was obtained.  The cystic artery was lateral to the cystic duct. The cystic duct was clearly identified and bluntly dissected circumferentially. Again a large critical view was obtained. I felt the cystic artery need to be taken first so it was clipped twice proximally and once laterally and divided.  The cystic duct was ligated with a clip distally.   An incision was made in the cystic duct and the [facility] cholangiogram catheter introduced. The catheter was secured using a clip. A cholangiogram was then obtained which showed good visualization of the distal and proximal biliary tree with no sign of filling defects or obstruction.  Contrast flowed easily into the duodenum. The catheter was then removed.   The cystic duct was then ligated with clips and divided.    The gallbladder was dissected from the liver bed in  retrograde fashion with the electrocautery. The gallbladder  was removed and placed in an Ecco sac.  The gallbladder and Ecco sac were then removed through the umbilical port site. The liver bed was irrigated and inspected. Hemostasis was achieved with the electrocautery. Copious irrigation was utilized and was repeatedly aspirated until clear.  The pursestring suture was used to close the umbilical fascia.    We again inspected the right upper quadrant for hemostasis.  The umbilical closure was inspected and there was no air leak and nothing trapped within the closure. Pneumoperitoneum was released as we removed the trocars.  4-0 Monocryl was used to close the skin.   Benzoin, steri-strips, and clean dressings were applied. The patient was then extubated and brought to the recovery room in stable condition. Instrument, sponge, and needle counts were correct at closure and at the conclusion of the case.   Findings: Chronic Cholecystitis with Cholelithiasis  Estimated Blood Loss: Minimal         Drains: none         Specimens: Gallbladder           Complications: None; patient tolerated the procedure well.         Disposition: PACU - hemodynamically stable.         Condition: stable  Mary Sella. Andrey Campanile, MD, FACS General, Bariatric, & Minimally Invasive Surgery [facility], [place]

## 2016-05-26 NOTE — Anesthesia Preprocedure Evaluation (Addendum)
Anesthesia Evaluation    Airway Mallampati: II       Dental  (+) Teeth Intact, Dental Advisory Given   Pulmonary    breath sounds clear to auscultation       Cardiovascular  Rhythm:Regular Rate:Normal     Neuro/Psych Seizures -,  PSYCHIATRIC DISORDERS Anxiety Depression    GI/Hepatic GERD (asymptomatic)  Medicated,  Endo/Other    Renal/GU      Musculoskeletal   Abdominal   Peds  Hematology   Anesthesia Other Findings   Reproductive/Obstetrics                            Anesthesia Physical Anesthesia Plan  ASA: II  Anesthesia Plan: General   Post-op Pain Management:    Induction: Intravenous  Airway Management Planned: Oral ETT  Additional Equipment:   Intra-op Plan:   Post-operative Plan: Extubation in OR  Informed Consent: I have reviewed the patients History and Physical, chart, labs and discussed the procedure including the risks, benefits and alternatives for the proposed anesthesia with the patient or authorized representative who has indicated his/her understanding and acceptance.   Dental advisory given  Plan Discussed with: CRNA  Anesthesia Plan Comments: (Patient denies pregnancy.)       Anesthesia Quick Evaluation

## 2016-05-26 NOTE — Discharge Instructions (Signed)
[facility] °LAPAROSCOPIC SURGERY: POST OP INSTRUCTIONS °Always review your discharge instruction sheet given to you by the facility where your surgery was performed. °IF YOU HAVE DISABILITY OR FAMILY LEAVE FORMS, YOU MUST BRING THEM TO THE OFFICE FOR PROCESSING.   °DO NOT GIVE THEM TO YOUR DOCTOR. ° °1. A prescription for pain medication may be given to you upon discharge.  Take your pain medication as prescribed, if needed.  If narcotic pain medicine is not needed, then you may take acetaminophen (Tylenol) &/ or ibuprofen (Advil) as needed. °2. Take your usually prescribed medications unless otherwise directed. °3. If you need a refill on your pain medication, please contact your pharmacy.  They will contact our office to request authorization. Prescriptions will not be filled after 5pm or on week-ends. °4. You should follow a light diet the first few days after arrival home, such as soup and crackers, etc.  Be sure to include lots of fluids daily. °5. Most patients will experience some swelling and bruising in the area of the incisions.  Ice packs will help.  Swelling and bruising can take several days to resolve.  °6. It is common to experience some constipation if taking pain medication after surgery.  Increasing fluid intake and taking a stool softener (such as Colace) will usually help or prevent this problem from occurring.  A mild laxative (Milk of Magnesia or Miralax) should be taken according to package instructions if there are no bowel movements after 48 hours. °7. Unless discharge instructions indicate otherwise, you may remove your bandages 48 hours after surgery, and you may shower at that time.  You  have steri-strips (small skin tapes) in place directly over the incision.  These strips should be left on the skin for 7-10 days.  °8. ACTIVITIES:  You may resume regular (light) daily activities beginning the next day--such as daily self-care, walking, climbing stairs--gradually  increasing activities as tolerated.  You may have sexual intercourse when it is comfortable.  Refrain from any heavy lifting or straining until approved by your doctor. °a. You may drive when you are no longer taking prescription pain medication, you can comfortably wear a seatbelt, and you can safely maneuver your car and apply brakes. °9. You should see your doctor in the office for a follow-up appointment approximately 2-3 weeks after your surgery.  Make sure that you call for this appointment within a day or two after you arrive home to insure a convenient appointment time. °10. OTHER INSTRUCTIONS:  °WHEN TO CALL YOUR DOCTOR: °1. Fever over 101.0 °2. Inability to urinate °3. Continued bleeding from incision. °4. Increased pain, redness, or drainage from the incision. °5. Increasing abdominal pain ° °The clinic staff is available to answer your questions during regular business hours.  Please don’t hesitate to call and ask to speak to one of the nurses for clinical concerns.  If you have a medical emergency, go to the nearest emergency room or call 911.  A surgeon from [facility] is always on call at the hospital. °[address], [address], [place], [place]  [address] ? [address] [address], [place], [place]   [address] °[phone] ? [phone] ? FAX [phone] °Web site: [email or web address] ° ° ° ° ° °

## 2016-05-26 NOTE — Anesthesia Procedure Notes (Signed)
Procedure Name: Intubation Date/Time: 05/26/2016 1:17 PM Performed by: Valda Favia Pre-anesthesia Checklist: Patient identified, Emergency Drugs available, Suction available, Patient being monitored and Timeout performed Patient Re-evaluated:Patient Re-evaluated prior to inductionOxygen Delivery Method: Circle system utilized Preoxygenation: Pre-oxygenation with 100% oxygen Intubation Type: IV induction Ventilation: Mask ventilation without difficulty Laryngoscope Size: Mac and 4 Grade View: Grade I Tube type: Oral Tube size: 7.0 mm Number of attempts: 1 Airway Equipment and Method: Stylet Placement Confirmation: ETT inserted through vocal cords under direct vision,  positive ETCO2 and breath sounds checked- equal and bilateral Secured at: 20 cm Tube secured with: Tape Dental Injury: Teeth and Oropharynx as per pre-operative assessment

## 2016-05-26 NOTE — Interval H&P Note (Signed)
History and Physical Interval Note:  05/26/2016 11:47 AM  Kristi Fletcher  has presented today for surgery, with the diagnosis of Symptomatic cholelithiasis  The various methods of treatment have been discussed with the patient and family. After consideration of risks, benefits and other options for treatment, the patient has consented to  Procedure(s): LAPAROSCOPIC CHOLECYSTECTOMY WITH POSSIBLE NTRAOPERATIVE CHOLANGIOGRAM (N/A) as a surgical intervention .  The patient's history has been reviewed, patient examined, no change in status, stable for surgery.  I have reviewed the patient's chart and labs.  Questions were answered to the patient's satisfaction.    Mary Sella. Andrey Campanile, MD, FACS General, Bariatric, & Minimally Invasive Surgery [facility], [place]   [facility]

## 2016-05-26 NOTE — Anesthesia Postprocedure Evaluation (Signed)
Anesthesia Post Note  Patient: Lovina Zuver  Procedure(s) Performed: Procedure(s) (LRB): LAPAROSCOPIC CHOLECYSTECTOMY WITH POSSIBLE NTRAOPERATIVE CHOLANGIOGRAM (N/A)  Patient location during evaluation: PACU Anesthesia Type: General Level of consciousness: awake and alert Pain management: pain level controlled Vital Signs Assessment: post-procedure vital signs reviewed and stable Respiratory status: spontaneous breathing, nonlabored ventilation, respiratory function stable and patient connected to nasal cannula oxygen Cardiovascular status: blood pressure returned to baseline and stable Postop Assessment: no signs of nausea or vomiting Anesthetic complications: no       Last Vitals:  Vitals:   05/26/16 1545 05/26/16 1550  BP:    Pulse: 79   Resp: 17   Temp:  36.4 C    Last Pain:  Vitals:   05/26/16 1550  TempSrc:   PainSc: 0-No pain                 Valera Vallas DAVID

## 2016-05-27 ENCOUNTER — Encounter (HOSPITAL_COMMUNITY): Payer: Self-pay | Admitting: General Surgery

## 2019-05-07 ENCOUNTER — Encounter (HOSPITAL_COMMUNITY): Payer: Self-pay | Admitting: Family Medicine

## 2019-05-07 ENCOUNTER — Other Ambulatory Visit: Payer: Self-pay

## 2019-05-07 ENCOUNTER — Ambulatory Visit (HOSPITAL_COMMUNITY)
Admission: EM | Admit: 2019-05-07 | Discharge: 2019-05-07 | Disposition: A | Discharge status: Home / Self Care | Payer: Medicaid Other | Attending: Family Medicine | Admitting: Family Medicine

## 2019-05-07 DIAGNOSIS — M7750 Other enthesopathy of unspecified foot: Secondary | ICD-10-CM | POA: Diagnosis not present

## 2019-05-07 DIAGNOSIS — L853 Xerosis cutis: Secondary | ICD-10-CM | POA: Diagnosis not present

## 2019-05-07 MED ORDER — TRIAMCINOLONE ACETONIDE 0.1 % EX CREA: 1.0000 "application " | TOPICAL_CREAM | Freq: Two times a day (BID) | CUTANEOUS | 0 refills | Status: AC

## 2019-05-07 MED ORDER — PREDNISONE 50 MG PO TABS: ORAL_TABLET | ORAL | 1 refills | Status: DC

## 2019-05-07 NOTE — Discharge Instructions (Addendum)
If you are not significantly better by Thursday or Friday, please return for further evaluation.

## 2019-05-07 NOTE — ED Provider Notes (Signed)
[facility]    CSN: [id number] Arrival date & time: 05/07/19  1748      History   Chief Complaint Chief Complaint  Patient presents with  . Foot Pain    HPI Kristi Fletcher is a 29 y.o. female.   This is a 29 year old woman who is making her first visit to [facility] urgent care in over 3 years.  She is complaining of foot pain.  Pt c/o 10/10 sharp pain in feet bilat. She states the pain is in the anterior part of the ankle to anterior part of feet bilat. Pt states this started beginning of Feb. Pt has 2+ pedal pulse bilat. 2+ edema of left ankle, 1+ edema of right ankle, both extremities are warm to touch, cap refill less than 3 sec bilat.  Patient is seen here primary care doctor who has prescribed ibuprofen after performing x-rays that were negative.  Patient has no history of trauma.  She says the pain is worse when walking.  Patient is taking course of G T[employer] and childhood development and needs a note for missing school today.     Past Medical History:  Diagnosis Date  . Anxiety   . 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 Depression   . GERD (gastroesophageal reflux disease)   . Learning disability     There are no problems to display for this patient.   Past Surgical History:  Procedure Laterality Date  . CHOLECYSTECTOMY N/A 05/26/2016   Procedure: LAPAROSCOPIC CHOLECYSTECTOMY WITH POSSIBLE NTRAOPERATIVE CHOLANGIOGRAM;  Surgeon: Gaynelle Adu, MD;  Location: [facility] OR;  Service: General;  Laterality: N/A;    OB History   No obstetric history on file.      Home Medications    Prior to Admission medications   Medication Sig Start Date End Date Taking? Authorizing Provider  clotrimazole-betamethasone (LOTRISONE) cream Apply 1 application topically daily. To face 04/21/16   [provider]  meloxicam (MOBIC) 15 MG tablet Take 15 mg by mouth daily as needed for pain. 04/21/16   [provider]  [facility] 7/7/7 0.5/0.75/1-35 MG-MCG tablet Take 1  tablet by mouth daily as needed. 04/21/16   [provider]  ondansetron (ZOFRAN) 4 MG tablet TAKE 1 TABLET BY MOUTH 3 TIMES DAILY AS NEEDED FOR NAUSEA 03/30/16   [provider]  oxyCODONE (OXY IR/ROXICODONE) 5 MG immediate release tablet Take 1-2 tablets (5-10 mg total) by mouth every 6 (six) hours as needed for moderate pain, severe pain or breakthrough pain. 05/26/16   Gaynelle Adu, MD  predniSONE (DELTASONE) 50 MG tablet 1 daily with food 05/07/19   Elvina Sidle, MD  triamcinolone cream (KENALOG) 0.1 % Apply 1 application topically 2 (two) times daily. 05/07/19   Elvina Sidle, MD  VESICARE 5 MG tablet Take 5 mg by mouth daily as needed for bladder spasms. 05/07/16   [provider]    Family History Family History  Problem Relation Age of Onset  . Hypertension Mother     Social History Social History   Tobacco Use  . Smoking status: Never Smoker  . Smokeless tobacco: Never Used  Substance Use Topics  . Alcohol use: No  . Drug use: No     Allergies   No known allergies   Review of Systems Review of Systems  Musculoskeletal: Positive for gait problem.  All other systems reviewed and are negative.    Physical Exam Triage Vital Signs ED Triage Vitals [05/07/19 1824]  Enc Vitals Group  BP 133/85     Pulse Rate 91     Resp 16     Temp 98.2 F (36.8 C)     Temp Source Oral     SpO2 98 %     Weight      Height      Head Circumference      Peak Flow      Pain Score      Pain Loc      Pain Edu?      Excl. in [place]?    No data found.  Updated Vital Signs BP 133/85 (BP Location: Right Arm)   Pulse 91   Temp 98.2 F (36.8 C) (Oral)   Resp 16   Ht 5\' 5"  (1.651 m)   Wt 83 kg   SpO2 98%   BMI 30.45 kg/m    Physical Exam Vitals and nursing note reviewed.  Constitutional:      Appearance: Normal appearance. She is obese.  Eyes:     Conjunctiva/sclera: Conjunctivae normal.  Pulmonary:     Effort: Pulmonary effort is normal.    Musculoskeletal:        General: Tenderness present. No swelling, deformity or signs of injury. Normal range of motion.     Cervical back: Normal range of motion and neck supple.  Skin:    General: Skin is warm and dry.     Findings: Rash present.  Neurological:     General: No focal deficit present.     Mental Status: She is alert and oriented to person, place, and time.  Psychiatric:        Mood and Affect: Mood normal.        Behavior: Behavior normal.        Thought Content: Thought content normal.        Judgment: Judgment normal.        UC Treatments / Results  Labs (all labs ordered are listed, but only abnormal results are displayed)   Initial Impression / Assessment and Plan / UC Course  I have reviewed the triage vital signs and the nursing notes.  Pertinent labs & imaging results that were available during my care of the patient were reviewed by me and considered in my medical decision making (see chart for details).    Final Clinical Impressions(s) / UC Diagnoses   Final diagnoses:  Tendinitis of ankle  Xerosis of skin     Discharge Instructions     If you are not significantly better by Thursday or Friday, please return for further evaluation.    ED Prescriptions    Medication Sig Dispense Auth. Provider   predniSONE (DELTASONE) 50 MG tablet 1 daily with food 6 tablet Robyn Haber, MD   triamcinolone cream (KENALOG) 0.1 % Apply 1 application topically 2 (two) times daily. 30 g Robyn Haber, MD     I have reviewed the PDMP during this encounter.   Robyn Haber, MD 05/07/19 Bosie Helper

## 2019-05-07 NOTE — ED Triage Notes (Addendum)
Pt c/o 10/10 sharp pain in feet bilat. She states the pain is in the anterior part of the ankle to anterior part of feet bilat. Pt states this started beginning of Feb. Pt deneis injury. Pt was able to walk to exam room. Pt has 2+ pedal pulse bilat. 2+ edema of left ankle, 1+ edema of right ankle, both extremities are warm to touch, cap refill less than 3 sec bilat.

## 2023-07-22 ENCOUNTER — Emergency Department (HOSPITAL_COMMUNITY)
Admission: EM | Admit: 2023-07-22 | Discharge: 2023-07-23 | Disposition: A | Discharge status: Home / Self Care | Payer: MEDICAID | Attending: Emergency Medicine | Admitting: Emergency Medicine

## 2023-07-22 ENCOUNTER — Other Ambulatory Visit: Payer: Self-pay

## 2023-07-22 ENCOUNTER — Encounter (HOSPITAL_COMMUNITY): Payer: Self-pay | Admitting: *Deleted

## 2023-07-22 ENCOUNTER — Emergency Department (HOSPITAL_COMMUNITY): Payer: MEDICAID

## 2023-07-22 DIAGNOSIS — N3000 Acute cystitis without hematuria: Secondary | ICD-10-CM | POA: Diagnosis not present

## 2023-07-22 DIAGNOSIS — R079 Chest pain, unspecified: Secondary | ICD-10-CM | POA: Diagnosis present

## 2023-07-22 DIAGNOSIS — R0789 Other chest pain: Secondary | ICD-10-CM | POA: Insufficient documentation

## 2023-07-22 LAB — COMPREHENSIVE METABOLIC PANEL WITH GFR
ALT: 15 U/L (ref 0–44)
AST: 18 U/L (ref 15–41)
Albumin: 3.4 g/dL — ABNORMAL LOW (ref 3.5–5.0)
Alkaline Phosphatase: 66 U/L (ref 38–126)
Anion gap: 11 (ref 5–15)
BUN: 12 mg/dL (ref 6–20)
CO2: 23 mmol/L (ref 22–32)
Calcium: 9.2 mg/dL (ref 8.9–10.3)
Chloride: 106 mmol/L (ref 98–111)
Creatinine, Ser: 0.95 mg/dL (ref 0.44–1.00)
GFR, Estimated: >60 mL/min (ref >60)
Glucose, Bld: 104 mg/dL — ABNORMAL HIGH (ref 70–99)
Potassium: 3.7 mmol/L (ref 3.5–5.1)
Sodium: 140 mmol/L (ref 135–145)
Total Bilirubin: 0.5 mg/dL (ref 0.0–1.2)
Total Protein: 7.6 g/dL (ref 6.5–8.1)

## 2023-07-22 LAB — CBC
HCT: 36.2 % (ref 36.0–46.0)
Hemoglobin: 11.3 g/dL — ABNORMAL LOW (ref 12.0–15.0)
MCH: 26.1 pg (ref 26.0–34.0)
MCHC: 31.2 g/dL (ref 30.0–36.0)
MCV: 83.6 fL (ref 80.0–100.0)
Platelets: 312 10*3/uL (ref 150–400)
RBC: 4.33 MIL/uL (ref 3.87–5.11)
RDW: 14.7 % (ref 11.5–15.5)
WBC: 6.8 10*3/uL (ref 4.0–10.5)
nRBC: 0 % (ref 0.0–0.2)

## 2023-07-22 LAB — RESP PANEL BY RT-PCR (RSV, FLU A&B, COVID)  RVPGX2
Influenza A by PCR: NEGATIVE
Influenza B by PCR: NEGATIVE
Resp Syncytial Virus by PCR: NEGATIVE
SARS Coronavirus 2 by RT PCR: NEGATIVE

## 2023-07-22 LAB — HCG, SERUM, QUALITATIVE: Preg, Serum: NEGATIVE

## 2023-07-22 LAB — LIPASE, BLOOD: Lipase: 31 U/L (ref 11–51)

## 2023-07-22 NOTE — ED Triage Notes (Signed)
 The pt is c/0 rt sided chest pain and rt rib pain she denies any trauma  generalized body aches   since Wednesday  no sob at present.Aaron Aas lmp may 15

## 2023-07-23 LAB — URINALYSIS, ROUTINE W REFLEX MICROSCOPIC
Bilirubin Urine: NEGATIVE
Glucose, UA: NEGATIVE mg/dL
Hgb urine dipstick: NEGATIVE
Ketones, ur: NEGATIVE mg/dL
Nitrite: POSITIVE — AB
Protein, ur: 30 mg/dL — AB
Specific Gravity, Urine: 1.015 (ref 1.005–1.030)
WBC, UA: >50 WBC/hpf (ref 0–5)
pH: 5 (ref 5.0–8.0)

## 2023-07-23 MED ORDER — CEPHALEXIN 500 MG PO CAPS: 500.0000 mg | ORAL_CAPSULE | Freq: Four times a day (QID) | ORAL | 0 refills | Status: DC

## 2023-07-23 NOTE — ED Provider Notes (Signed)
 [facility] Provider Note   CSN: [id number] Arrival date & time: 07/22/23  1728     History  Chief Complaint  Patient presents with   Chest Pain    Kristi Fletcher is a 33 y.o. female.  Patient presents to the emergency room with right sided rib pain with some generalized bodyaches since Wednesday of this week.  She denies chest pain, abdominal pain, nausea, vomiting, shortness of breath.  She does state that there is some slight increase of pain with deep inspiration and the pain is aggravated with movement and touch.   Chest Pain      Home Medications Prior to Admission medications   Medication Sig Start Date End Date Taking? Authorizing Provider  cephALEXin (KEFLEX) 500 MG capsule Take 1 capsule (500 mg total) by mouth 4 (four) times daily. 07/23/23  Yes Elisa Guest, PA-C  clotrimazole-betamethasone (LOTRISONE) cream Apply 1 application topically daily. To face 04/21/16   [provider]  meloxicam (MOBIC) 15 MG tablet Take 15 mg by mouth daily as needed for pain. 04/21/16   [provider]  [facility] 7/7/7 0.5/0.75/1-35 MG-MCG tablet Take 1 tablet by mouth daily as needed. 04/21/16   [provider]  ondansetron  (ZOFRAN ) 4 MG tablet TAKE 1 TABLET BY MOUTH 3 TIMES DAILY AS NEEDED FOR NAUSEA 03/30/16   [provider]  oxyCODONE  (OXY IR/ROXICODONE ) 5 MG immediate release tablet Take 1-2 tablets (5-10 mg total) by mouth every 6 (six) hours as needed for moderate pain, severe pain or breakthrough pain. 05/26/16   Aldean Hummingbird, MD  predniSONE  (DELTASONE ) 50 MG tablet 1 daily with food 05/07/19   Dain Drown, MD  triamcinolone  cream (KENALOG ) 0.1 % Apply 1 application topically 2 (two) times daily. 05/07/19   Dain Drown, MD  VESICARE 5 MG tablet Take 5 mg by mouth daily as needed for bladder spasms. 05/07/16   [provider]      Allergies    No known allergies    Review of Systems   Review  of Systems  Cardiovascular:  Positive for chest pain.    Physical Exam Updated Vital Signs BP 110/77 (BP Location: Left Arm)   Pulse 94   Temp 98.5 F (36.9 C)   Resp 16   Ht 5\' 5"  (1.651 m)   Wt 83 kg   LMP 07/02/2023   SpO2 100%   BMI 30.45 kg/m  Physical Exam Vitals and nursing note reviewed.  Constitutional:      General: She is not in acute distress.    Appearance: She is well-developed.  HENT:     Head: Normocephalic and atraumatic.  Eyes:     Conjunctiva/sclera: Conjunctivae normal.  Cardiovascular:     Rate and Rhythm: Normal rate and regular rhythm.  Pulmonary:     Effort: Pulmonary effort is normal. No respiratory distress.     Breath sounds: Normal breath sounds.  Chest:     Chest wall: Tenderness present.     Comments: Mild right sided chest wall tenderness Abdominal:     Palpations: Abdomen is soft.     Tenderness: There is no abdominal tenderness.  Musculoskeletal:        General: No swelling.     Cervical back: Neck supple.  Skin:    General: Skin is warm and dry.     Capillary Refill: Capillary refill takes less than 2 seconds.  Neurological:     Mental Status: She is alert.  Psychiatric:  Mood and Affect: Mood normal.     ED Results / Procedures / Treatments   Labs (all labs ordered are listed, but only abnormal results are displayed) Labs Reviewed  COMPREHENSIVE METABOLIC PANEL WITH GFR - Abnormal; Notable for the following components:      Result Value   Glucose, Bld 104 (*)    Albumin 3.4 (*)    All other components within normal limits  CBC - Abnormal; Notable for the following components:   Hemoglobin 11.3 (*)    All other components within normal limits  URINALYSIS, ROUTINE W REFLEX MICROSCOPIC - Abnormal; Notable for the following components:   APPearance CLOUDY (*)    Protein, ur 30 (*)    Nitrite POSITIVE (*)    Leukocytes,Ua LARGE (*)    Bacteria, UA MANY (*)    All other components within normal limits  RESP PANEL  BY RT-PCR (RSV, FLU A&B, COVID)  RVPGX2  LIPASE, BLOOD  HCG, SERUM, QUALITATIVE    EKG EKG Interpretation Date/Time:  Saturday Jul 22 2023 17:46:05 EDT Ventricular Rate:  100 PR Interval:  138 QRS Duration:  66 QT Interval:  356 QTC Calculation: 459 R Axis:   13  Text Interpretation: Normal sinus rhythm Nonspecific ST abnormality Abnormal ECG No previous ECGs available Interpretation limited secondary to artifact Confirmed by Eldon Greenland ([id number]) on 07/22/2023 11:14:56 PM  Radiology DG Chest 2 View Result Date: 07/22/2023 EXAM: 2 VIEW(S) XRAY OF THE CHEST 07/22/2023 11:39:44 PM COMPARISON: None available. CLINICAL HISTORY: Right sided chest wall pain. Right sided CP. FINDINGS: LUNGS AND PLEURA: No focal pulmonary opacity. No pulmonary edema. No pleural effusion. No pneumothorax. HEART AND MEDIASTINUM: No acute abnormality of the cardiac and mediastinal silhouettes. BONES AND SOFT TISSUES: No acute osseous abnormality. IMPRESSION: 1. No acute process. Electronically signed by: Zadie Herter MD 07/22/2023 11:41 PM EDT RP Workstation: [id number]    Procedures Procedures    Medications Ordered in ED Medications - No data to display  ED Course/ Medical Decision Making/ A&P                                 Medical Decision Making Amount and/or Complexity of Data Reviewed Radiology: ordered.  Risk Prescription drug management.   This patient presents to the ED for concern of right sided rib pain, this involves an extensive number of treatment options, and is a complaint that carries with it a high risk of complications and morbidity.  The differential diagnosis includes fracture, dislocation, soft tissue injury, others   Co morbidities / Chronic conditions that complicate the patient evaluation  Anxiety, GERD   Additional history obtained:  Additional history obtained from EMR   Lab Tests:  I Ordered, and personally interpreted labs.  The pertinent results  include: Negative respiratory panel   Imaging Studies ordered:  I ordered imaging studies including chest x-ray I independently visualized and interpreted imaging which showed no acute findings I agree with the radiologist interpretation   Cardiac Monitoring: / EKG:  The patient was maintained on a cardiac monitor.  I personally viewed and interpreted the cardiac monitored which showed an underlying rhythm of: Normal sinus rhythm   Test / Admission - Considered:  Unremarkable EKG.  Not an ACS presentation.  She denies any shortness of breath.  She denies nausea, vomiting, radiation of symptoms and describes it as a rib pain.  This pain is reproducible with movement.  The right side of  her chest wall is tender to the touch.  This seems like a musculoskeletal injury.  She does have evidence of a UTI on labs.   Plan to discharge home with recommendations for ibuprofen for pain control, keflex for UTI, and follow-up with primary care provider.  Return precautions provided.         Final Clinical Impression(s) / ED Diagnoses Final diagnoses:  Chest wall pain  Acute cystitis without hematuria    Rx / DC Orders ED Discharge Orders          Ordered    cephALEXin (KEFLEX) 500 MG capsule  4 times daily        07/23/23 0050              Elisa Guest, PA-C 07/23/23 0052    Eldon Greenland, MD 07/23/23 [phone]

## 2023-07-23 NOTE — Discharge Instructions (Addendum)
 You were seen this morning for chest wall pain.  This is likely musculoskeletal in nature.  Please take ibuprofen for pain control. You do have a urinary tract infection and I've prescribed antibiotics. Please take until completed.  Follow-up as needed with your primary care provider.  If you develop any life-threatening symptoms return to the emergency department.

## 2023-08-16 ENCOUNTER — Encounter (HOSPITAL_COMMUNITY): Payer: Self-pay

## 2023-08-16 ENCOUNTER — Ambulatory Visit (HOSPITAL_COMMUNITY)
Admission: EM | Admit: 2023-08-16 | Discharge: 2023-08-16 | Disposition: A | Discharge status: Home / Self Care | Payer: MEDICAID | Attending: Emergency Medicine | Admitting: Emergency Medicine

## 2023-08-16 DIAGNOSIS — R197 Diarrhea, unspecified: Secondary | ICD-10-CM | POA: Insufficient documentation

## 2023-08-16 DIAGNOSIS — R112 Nausea with vomiting, unspecified: Secondary | ICD-10-CM | POA: Diagnosis not present

## 2023-08-16 DIAGNOSIS — R0789 Other chest pain: Secondary | ICD-10-CM | POA: Diagnosis not present

## 2023-08-16 LAB — COMPREHENSIVE METABOLIC PANEL WITH GFR
ALT: 173 U/L — ABNORMAL HIGH (ref 0–44)
AST: 142 U/L — ABNORMAL HIGH (ref 15–41)
Albumin: 3.6 g/dL (ref 3.5–5.0)
Alkaline Phosphatase: 81 U/L (ref 38–126)
Anion gap: 12 (ref 5–15)
BUN: 12 mg/dL (ref 6–20)
CO2: 23 mmol/L (ref 22–32)
Calcium: 9.5 mg/dL (ref 8.9–10.3)
Chloride: 103 mmol/L (ref 98–111)
Creatinine, Ser: 1.09 mg/dL — ABNORMAL HIGH (ref 0.44–1.00)
GFR, Estimated: >60 mL/min (ref >60)
Glucose, Bld: 72 mg/dL (ref 70–99)
Potassium: 3.6 mmol/L (ref 3.5–5.1)
Sodium: 138 mmol/L (ref 135–145)
Total Bilirubin: 0.6 mg/dL (ref 0.0–1.2)
Total Protein: 7.6 g/dL (ref 6.5–8.1)

## 2023-08-16 LAB — CBC
HCT: 34.4 % — ABNORMAL LOW (ref 36.0–46.0)
Hemoglobin: 11 g/dL — ABNORMAL LOW (ref 12.0–15.0)
MCH: 26.5 pg (ref 26.0–34.0)
MCHC: 32 g/dL (ref 30.0–36.0)
MCV: 82.9 fL (ref 80.0–100.0)
Platelets: 305 10*3/uL (ref 150–400)
RBC: 4.15 MIL/uL (ref 3.87–5.11)
RDW: 16.7 % — ABNORMAL HIGH (ref 11.5–15.5)
WBC: 7.2 10*3/uL (ref 4.0–10.5)
nRBC: 0 % (ref 0.0–0.2)

## 2023-08-16 LAB — LIPASE, BLOOD: Lipase: 30 U/L (ref 11–51)

## 2023-08-16 MED ORDER — LOPERAMIDE HCL 2 MG PO CAPS: 2.0000 mg | ORAL_CAPSULE | Freq: Four times a day (QID) | ORAL | 0 refills | Status: AC | PRN

## 2023-08-16 MED ORDER — ONDANSETRON 4 MG PO TBDP: 4.0000 mg | ORAL_TABLET | Freq: Three times a day (TID) | ORAL | 0 refills | Status: AC | PRN

## 2023-08-16 NOTE — Discharge Instructions (Signed)
 We have drawn some labs today to evaluate underlying causes for your symptoms and to rule out electrolyte imbalance related to your symptoms.  If these results are concerning or require any treatment someone will call to advise any additional treatment necessary. Take Zofran  every 8 hours as needed for nausea and vomiting.  This will dissolve under your tongue. Take Imodium 4 times daily as needed for diarrhea. Make sure you are staying hydrated and getting plenty of rest. Follow-up with primary care provider for further evaluation and management of your reoccurring symptoms. Return here as needed.  If you develop severe abdominal pain, excessive vomiting, blood in vomit or stool, high fevers, or passing out please seek immediate medical treatment in the emergency department.

## 2023-08-16 NOTE — ED Provider Notes (Addendum)
 [facility]    CSN: [id number] Arrival date & time: 08/16/23  1611      History   Chief Complaint Chief Complaint  Patient presents with   Chest Pain    HPI Kristi Fletcher is a 33 y.o. female.   Patient presents with nausea, vomiting, and diarrhea that began on 6/23.  Patient states that she has had approximately 2-3 episodes of vomiting per day and 2-3 episodes of diarrhea per day.  Patient states that due to the symptoms she has had some chest discomfort as well.  Patient states that she has had abdominal cramping only when vomiting or diarrhea occur.  Patient denies persistent abdominal pain, fever, body aches, chills, dizziness, or loss of consciousness.  Patient does report history of acid reflux.  Patient states that she recently was seen in the emergency department for similar symptoms, but was told that she had a urinary tract infection at that time.  Patient denies any urinary symptoms at this time.  Patient does report history of cholecystectomy.  Denies any other abdominal surgeries.  LMP 08/02/2023.  Denies abnormal vaginal bleeding or discharge.  Patient reports she has been taking Nexium daily and Alka-Seltzer without relief.  The history is provided by the patient and medical records.  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   Past Surgical History:  Procedure Laterality Date   CHOLECYSTECTOMY N/A 05/26/2016   Procedure: LAPAROSCOPIC CHOLECYSTECTOMY WITH POSSIBLE NTRAOPERATIVE CHOLANGIOGRAM;  Surgeon: Camellia Blush, MD;  Location: [facility] OR;  Service: General;  Laterality: N/A;    OB History   No obstetric history on file.      Home Medications    Prior to Admission medications   Medication Sig Start Date End Date Taking? Authorizing Provider   hydrOXYzine (ATARAX) 25 MG tablet Take 25 mg by mouth 2 (two) times daily as needed for anxiety. 02/15/22  Yes [provider]  loperamide (IMODIUM) 2 MG capsule Take 1 capsule (2 mg total) by mouth 4 (four) times daily as needed for diarrhea or loose stools. 08/16/23  Yes Johnie Flaming A, NP  ondansetron  (ZOFRAN -ODT) 4 MG disintegrating tablet Take 1 tablet (4 mg total) by mouth every 8 (eight) hours as needed for nausea or vomiting. 08/16/23  Yes Johnie Flaming A, NP  sertraline (ZOLOFT) 50 MG tablet Take 50 mg by mouth daily. 03/29/21  Yes [provider]  busPIRone (BUSPAR) 5 MG tablet Take 5 mg by mouth 2 (two) times daily as needed.    [provider]  esomeprazole (NEXIUM) 20 MG capsule Take 1 tablet by mouth daily.    [provider]  [facility] 7/7/7 0.5/0.75/1-35 MG-MCG tablet Take 1 tablet by mouth daily as needed. 04/21/16   [provider]  triamcinolone  cream (KENALOG ) 0.1 % Apply 1 application topically 2 (two) times daily. 05/07/19   Mario Million, MD  VESICARE 5 MG tablet Take 5 mg by mouth daily as needed for bladder spasms. 05/07/16   [provider]    Family History Family History  Problem Relation Age of Onset   Hypertension Mother     Social History Social History   Tobacco Use   Smoking status: Never   Smokeless tobacco: Never  Vaping Use  Vaping status: Never Used  Substance Use Topics   Alcohol use: Yes    Comment: sometimes   Drug use: Yes    Types: Marijuana     Allergies   No known allergies   Review of Systems Review of Systems  Cardiovascular:  Positive for chest pain.   Per HPI  Physical Exam Triage Vital Signs ED Triage Vitals  Encounter Vitals Group     BP 08/16/23 1614 123/86     Girls Systolic BP Percentile --      Girls Diastolic BP Percentile --      Boys Systolic BP Percentile --      Boys Diastolic BP Percentile --      Pulse Rate 08/16/23 1614 78     Resp 08/16/23 1614 16      Temp 08/16/23 1614 98 F (36.7 C)     Temp Source 08/16/23 1614 Oral     SpO2 08/16/23 1614 95 %     Weight --      Height --      Head Circumference --      Peak Flow --      Pain Score 08/16/23 1616 10     Pain Loc --      Pain Education --      Exclude from Growth Chart --    No data found.  Updated Vital Signs BP 123/86 (BP Location: Right Arm)   Pulse 78   Temp 98 F (36.7 C) (Oral)   Resp 16   LMP 08/02/2023 (Exact Date)   SpO2 95%   Visual Acuity Right Eye Distance:   Left Eye Distance:   Bilateral Distance:    Right Eye Near:   Left Eye Near:    Bilateral Near:     Physical Exam Vitals and nursing note reviewed.  Constitutional:      General: She is awake. She is not in acute distress.    Appearance: Normal appearance. She is well-developed and well-groomed. She is not ill-appearing.   Cardiovascular:     Rate and Rhythm: Normal rate and regular rhythm.  Pulmonary:     Effort: Pulmonary effort is normal.     Breath sounds: Normal breath sounds.  Abdominal:     General: Abdomen is protuberant. Bowel sounds are normal.     Palpations: Abdomen is soft.     Tenderness: There is abdominal tenderness in the epigastric area and periumbilical area. There is no right CVA tenderness, left CVA tenderness, guarding or rebound. Negative signs include Murphy's sign.   Skin:    General: Skin is warm and dry.   Neurological:     Mental Status: She is alert.   Psychiatric:        Behavior: Behavior is cooperative.      UC Treatments / Results  Labs (all labs ordered are listed, but only abnormal results are displayed) Labs Reviewed  COMPREHENSIVE METABOLIC PANEL WITH GFR  CBC  LIPASE, BLOOD    EKG   Radiology No results found.  Procedures Procedures (including critical care time)  Medications Ordered in UC Medications - No data to display  Initial Impression / Assessment and Plan / UC Course  I have reviewed the triage vital signs and the  nursing notes.  Pertinent labs & imaging results that were available during my care of the patient were reviewed by me and considered in my medical decision making (see chart for details).     Patient is overall well-appearing.  Vitals  are stable.  Upon assessment mild tenderness noted to epigastric and periumbilical regions.  No other significant findings upon exam.  Ordered CBC, CMP, and lipase to evaluate for underlying causes related to symptoms and to rule out electrolyte imbalance due to recent vomiting and diarrhea.  Prescribe Zofran  as needed for nausea and vomiting.  Prescribed Imodium as needed for diarrhea.  Discussed importance of hydration.  Discussed follow-up, return, and strict ER precautions. Final Clinical Impressions(s) / UC Diagnoses   Final diagnoses:  Nausea vomiting and diarrhea  Chest wall pain     Discharge Instructions      We have drawn some labs today to evaluate underlying causes for your symptoms and to rule out electrolyte imbalance related to your symptoms.  If these results are concerning or require any treatment someone will call to advise any additional treatment necessary. Take Zofran  every 8 hours as needed for nausea and vomiting.  This will dissolve under your tongue. Take Imodium 4 times daily as needed for diarrhea. Make sure you are staying hydrated and getting plenty of rest. Follow-up with primary care provider for further evaluation and management of your reoccurring symptoms. Return here as needed.  If you develop severe abdominal pain, excessive vomiting, blood in vomit or stool, high fevers, or passing out please seek immediate medical treatment in the emergency department.    ED Prescriptions     Medication Sig Dispense Auth. Provider   ondansetron  (ZOFRAN -ODT) 4 MG disintegrating tablet Take 1 tablet (4 mg total) by mouth every 8 (eight) hours as needed for nausea or vomiting. 10 tablet Johnie Flaming A, NP   loperamide (IMODIUM) 2  MG capsule Take 1 capsule (2 mg total) by mouth 4 (four) times daily as needed for diarrhea or loose stools. 12 capsule Johnie Flaming A, NP      PDMP not reviewed this encounter.   Johnie Flaming LABOR, NP 08/16/23 1706    Johnie Flaming A, NP 08/16/23 [phone]

## 2023-08-16 NOTE — ED Triage Notes (Signed)
 Patient here today with c/o chest soreness, nausea, vomiting, and diarrhea since Monday. Patient has a h/o acid reflux.

## 2023-08-17 ENCOUNTER — Ambulatory Visit (HOSPITAL_COMMUNITY): Payer: Self-pay
